# Patient Record
Sex: Male | Born: 1990 | Race: Black or African American | Hispanic: No | Marital: Single | Smoking: Current every day smoker
Health system: Southern US, Community
[De-identification: ages and names within clinical notes are randomized; demographics above are authoritative.]

## PROBLEM LIST (undated history)

## (undated) DIAGNOSIS — J45909 Unspecified asthma, uncomplicated: Secondary | ICD-10-CM

## (undated) HISTORY — PX: OTHER SURGICAL HISTORY: SHX169

---
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
## 2010-08-13 ENCOUNTER — Emergency Department: Payer: Self-pay | Admitting: Emergency Medicine

## 2011-06-18 ENCOUNTER — Emergency Department: Payer: Self-pay | Admitting: Internal Medicine

## 2012-03-31 ENCOUNTER — Emergency Department: Payer: Self-pay | Admitting: Emergency Medicine

## 2012-04-17 ENCOUNTER — Emergency Department: Payer: Self-pay | Admitting: Emergency Medicine

## 2014-01-10 ENCOUNTER — Emergency Department: Payer: Self-pay | Admitting: Emergency Medicine

## 2015-04-05 ENCOUNTER — Emergency Department: Payer: Self-pay

## 2015-04-05 ENCOUNTER — Emergency Department
Admission: EM | Admit: 2015-04-05 | Discharge: 2015-04-05 | Disposition: A | Discharge status: Home / Self Care | Payer: Self-pay | Attending: Emergency Medicine | Admitting: Emergency Medicine

## 2015-04-05 DIAGNOSIS — Z720 Tobacco use: Secondary | ICD-10-CM | POA: Insufficient documentation

## 2015-04-05 DIAGNOSIS — R079 Chest pain, unspecified: Secondary | ICD-10-CM

## 2015-04-05 DIAGNOSIS — R062 Wheezing: Secondary | ICD-10-CM

## 2015-04-05 DIAGNOSIS — J45901 Unspecified asthma with (acute) exacerbation: Secondary | ICD-10-CM | POA: Insufficient documentation

## 2015-04-05 HISTORY — DX: Unspecified asthma, uncomplicated: J45.909

## 2015-04-05 LAB — CBC
HCT: 47.8 % (ref 40.0–52.0)
Hemoglobin: 16.2 g/dL (ref 13.0–18.0)
MCH: 30.9 pg (ref 26.0–34.0)
MCHC: 33.9 g/dL (ref 32.0–36.0)
MCV: 91 fL (ref 80.0–100.0)
PLATELETS: 170 10*3/uL (ref 150–440)
RBC: 5.25 MIL/uL (ref 4.40–5.90)
RDW: 12.9 % (ref 11.5–14.5)
WBC: 8.5 10*3/uL (ref 3.8–10.6)

## 2015-04-05 LAB — BASIC METABOLIC PANEL
Anion gap: 8 (ref 5–15)
BUN: 6 mg/dL (ref 6–20)
CALCIUM: 9.3 mg/dL (ref 8.9–10.3)
CHLORIDE: 105 mmol/L (ref 101–111)
CO2: 22 mmol/L (ref 22–32)
CREATININE: 0.87 mg/dL (ref 0.61–1.24)
GFR calc Af Amer: >60 mL/min (ref >60)
GFR calc non Af Amer: >60 mL/min (ref >60)
GLUCOSE: 84 mg/dL (ref 65–99)
Potassium: 5.2 mmol/L — ABNORMAL HIGH (ref 3.5–5.1)
Sodium: 135 mmol/L (ref 135–145)

## 2015-04-05 LAB — TROPONIN I

## 2015-04-05 MED ORDER — ALBUTEROL SULFATE HFA 108 (90 BASE) MCG/ACT IN AERS: 2.0000 | INHALATION_SPRAY | Freq: Four times a day (QID) | RESPIRATORY_TRACT | Status: AC | PRN

## 2015-04-05 NOTE — ED Provider Notes (Signed)
[facility] Emergency Department Provider Note   ____________________________________________  Time seen:  I have reviewed the triage vital signs and the triage nursing note.  HISTORY  Chief Complaint Chest Pain   Historian Patient  HPI Manuel Adams is a 24 y.o. male who has a history of asthma, as well as smoking, who started experiencing some central left chest pressure associated with wheezing this morning around 9 or 10 AM. He had walked to work early this morning and experienced the discomfort at work where he has a nonexertional job at the [employer]. Symptoms progressed and around 12 he had a more significant episode where he felt his and lightheaded while wheezing. Symptoms did abate on their own without any albuterol. Patient states he took oxygen on the way in with EMS. His blood sugar was found to be in the 60s, and he reports he had not eaten since the middle of the night, and received an amp of D50 by IV because he did not want to swallow the sugar drink that the EMS provided.  Patient states he has lost some weight recently, and is still trying to lose weight as he is overweight. He is currently smoker. He has not seen a physician recently. He does not have an albuterol inhaler due to no PCP and difficult access to care given that he has no car.    Past Medical History  Diagnosis Date  . Asthma     There are no active problems to display for this patient.   History reviewed. No pertinent past surgical history.  Current Outpatient Rx  Name  Route  Sig  Dispense  Refill  . albuterol (PROVENTIL HFA;VENTOLIN HFA) 108 (90 BASE) MCG/ACT inhaler   Inhalation   Inhale 2 puffs into the lungs every 6 (six) hours as needed for wheezing or shortness of breath.   1 Inhaler   0     Allergies Review of patient's allergies indicates no known allergies.  No family history on file.  Social History Social History  Substance Use Topics  . Smoking  status: Current Every Day Smoker    Types: Cigarettes  . Smokeless tobacco: Never Used  . Alcohol Use: No    Review of Systems  Constitutional: Negative for fever. Eyes: Negative for visual changes. ENT: Negative for sore throat. Cardiovascular: Negative for palpitations. Respiratory: Negative for cough or sputum. Gastrointestinal: Negative for abdominal pain, vomiting and diarrhea. Genitourinary: Negative for dysuria. Musculoskeletal: Negative for back pain. Skin: Negative for rash. Neurological: Negative for headache. 10 point Review of Systems otherwise negative ____________________________________________   PHYSICAL EXAM:  VITAL SIGNS: ED Triage Vitals  Enc Vitals Group     BP 04/05/15 1242 137/62 mmHg     Pulse Rate 04/05/15 1242 70     Resp 04/05/15 1242 16     Temp 04/05/15 1242 99.2 F (37.3 C)     Temp Source 04/05/15 1242 Oral     SpO2 04/05/15 1242 97 %     Weight 04/05/15 1242 220 lb (99.791 kg)     Height 04/05/15 1242  (1.778 m)     Head Cir --      Peak Flow --      Pain Score 04/05/15 1243 5     Pain Loc --      Pain Edu? --      Excl. in GC? --      Constitutional: Alert and oriented. Well appearing and in no distress. Eyes:  Conjunctivae are normal. PERRL. Normal extraocular movements. ENT   Head: Normocephalic and atraumatic.   Nose: No congestion/rhinnorhea.   Mouth/Throat: Mucous membranes are moist.   Neck: No stridor. Cardiovascular/Chest: Normal rate, regular rhythm.  No murmurs, rubs, or gallops. Respiratory: Normal respiratory effort without tachypnea nor retractions. Breath sounds are clear and equal bilaterally. No wheezes/rales/rhonchi. Gastrointestinal: Soft. No distention, no guarding, no rebound. Nontender. Obese.  Genitourinary/rectal:Deferred Musculoskeletal: Nontender with normal range of motion in all extremities. No joint effusions.  No lower extremity tenderness.  No edema. Neurologic:  Normal speech and  language. No gross or focal neurologic deficits are appreciated. Skin:  Skin is warm, dry and intact. No rash noted. Psychiatric: Mood and affect are normal. Speech and behavior are normal. Patient exhibits appropriate insight and judgment.  ____________________________________________   EKG I, Governor Rooks, MD, the attending physician have personally viewed and interpreted all ECGs.  53 bpm. Sinus bradycardia. Narrow QRS. Normal axis. Normal ST and T-wave. ____________________________________________  LABS (pertinent positives/negatives)  Basic metabolic panel within normal limits except potassium 5.2 CBC within normal limits Troponin less than 0.03  ____________________________________________  RADIOLOGY All Xrays were viewed by me. Imaging interpreted by Radiologist.  Chest x-ray two-view: No acute abnormality __________________________________________  PROCEDURES  Procedure(s) performed: None  Critical Care performed: None  ____________________________________________   ED COURSE / ASSESSMENT AND PLAN  CONSULTATIONS: None  Pertinent labs & imaging results that were available during my care of the patient were reviewed by me and considered in my medical decision making (see chart for details).   This patient is young and has a history of asthma in the past and does not have an inhaler, and so I suspected his episodes today are probably related to wheezing/bronchospasm. However here in the emergency department he has absolutely no wheezing, so I am raising the consideration of whether or not his symptoms of chest pain could be cardiac in nature. His EKG is reassuring. His laboratory evaluation is negative. He does have risk factors of obesity, and smoking, and so I am going to refer him to follow-up with cardiology.  Patient needed to leave given his wife needed to be at work, and so he was unwilling to stay for repeat troponin I did let him know that I was not  recommending that he leave the hospital until his troponin repeated to be negative, however he understands the risk of missed heart attack and death and is going to leave.  He is alert and oriented and capable of decision-making.  Patient / Family / Caregiver informed of clinical course, medical decision-making process, and agree with plan.   I discussed return precautions, follow-up instructions, and discharged instructions with patient and/or family.  ___________________________________________   FINAL CLINICAL IMPRESSION(S) / ED DIAGNOSES   Final diagnoses:  Chest pain, unspecified  Wheezing       Governor Rooks, MD 04/05/15 1546

## 2015-04-05 NOTE — ED Notes (Signed)
Pt comes into the ED via EMS from home with c/o tightness in chest with diaphoresis and nausea today..states he has a hx of asthma, EMS reports pt FS60, they administered an Amp of D50 in route..states pt improved after D50.the patient states his chest feels better, skin is warm and dry on arrival..

## 2015-04-05 NOTE — ED Notes (Signed)
Unable to draw blood from right AC 20g for second troponin.  Patient refused to allow nurse to strait stick for troponin.

## 2015-04-05 NOTE — Discharge Instructions (Signed)
You were evaluated after an episode of chest pain with wheezing, and her examiner evaluation are reassuring here in the emergency department. However due to your risk factors, I am recommending he follow-up with a cardiologist to consider additional evaluation which might include a stress test. Call Dr. Elissa Hefty office for an appointment next available in the next 2 days.  You're being prescribed albuterol inhaler for as needed use for episodes of wheezing. I'm also recommending you follow with primary care physician, you may follow-up at the [facility] clinic.  Return to the emergency room for any worsening condition including trouble breathing, dizziness, passing out, chest pain, sweating, or any other symptoms concerning to you.   Nonspecific Chest Pain It is often hard to find the cause of chest pain. There is always a chance that your pain could be related to something serious, such as a heart attack or a blood clot in your lungs. Chest pain can also be caused by conditions that are not life-threatening. If you have chest pain, it is very important to follow up with your doctor.  HOME CARE  If you were prescribed an antibiotic medicine, finish it all even if you start to feel better.  Avoid any activities that cause chest pain.  Do not use any tobacco products, including cigarettes, chewing tobacco, or electronic cigarettes. If you need help quitting, ask your doctor.  Do not drink alcohol.  Take medicines only as told by your doctor.  Keep all follow-up visits as told by your doctor. This is important. This includes any further testing if your chest pain does not go away.  Your doctor may tell you to keep your head raised (elevated) while you sleep.  Make lifestyle changes as told by your doctor. These may include:  Getting regular exercise. Ask your doctor to suggest some activities that are safe for you.  Eating a heart-healthy diet. Your doctor or a diet specialist (dietitian)  can help you to learn healthy eating options.  Maintaining a healthy weight.  Managing diabetes, if necessary.  Reducing stress. GET HELP IF:  Your chest pain does not go away, even after treatment.  You have a rash with blisters on your chest.  You have a fever. GET HELP RIGHT AWAY IF:  Your chest pain is worse.  You have an increasing cough, or you cough up blood.  You have severe belly (abdominal) pain.  You feel extremely weak.  You pass out (faint).  You have chills.  You have sudden, unexplained chest discomfort.  You have sudden, unexplained discomfort in your arms, back, neck, or jaw.  You have shortness of breath at any time.  You suddenly start to sweat, or your skin gets clammy.  You feel nauseous.  You vomit.  You suddenly feel light-headed or dizzy.  Your heart begins to beat quickly, or it feels like it is skipping beats. These symptoms may be an emergency. Do not wait to see if the symptoms will go away. Get medical help right away. Call your local emergency services (911 in the [place] Do not drive yourself to the hospital.   This information is not intended to replace advice given to you by your health care provider. Make sure you discuss any questions you have with your health care provider.   Document Released: 12/04/2007 Document Revised: 07/08/2014 Document Reviewed: 01/21/2014 Elsevier Interactive Patient Education 2016 [employer]   Asthma, Acute Bronchospasm Acute bronchospasm caused by asthma is also referred to as an asthma attack. Bronchospasm  means your air passages become narrowed. The narrowing is caused by inflammation and tightening of the muscles in the air tubes (bronchi) in your lungs. This can make it hard to breathe or cause you to wheeze and cough. CAUSES Possible triggers are:  Animal dander from the skin, hair, or feathers of animals.  Dust mites contained in house dust.  Cockroaches.  Pollen from trees or  grass.  Mold.  Cigarette or tobacco smoke.  Air pollutants such as dust, household cleaners, hair sprays, aerosol sprays, paint fumes, strong chemicals, or strong odors.  Cold air or weather changes. Cold air may trigger inflammation. Winds increase molds and pollens in the air.  Strong emotions such as crying or laughing hard.  Stress.  Certain medicines such as aspirin or beta-blockers.  Sulfites in foods and drinks, such as dried fruits and wine.  Infections or inflammatory conditions, such as a flu, cold, or inflammation of the nasal membranes (rhinitis).  Gastroesophageal reflux disease (GERD). GERD is a condition where stomach acid backs up into your esophagus.  Exercise or strenuous activity. SIGNS AND SYMPTOMS   Wheezing.  Excessive coughing, particularly at night.  Chest tightness.  Shortness of breath. DIAGNOSIS  Your health care provider will ask you about your medical history and perform a physical exam. A chest X-ray or blood testing may be performed to look for other causes of your symptoms or other conditions that may have triggered your asthma attack. TREATMENT  Treatment is aimed at reducing inflammation and opening up the airways in your lungs. Most asthma attacks are treated with inhaled medicines. These include quick relief or rescue medicines (such as bronchodilators) and controller medicines (such as inhaled corticosteroids). These medicines are sometimes given through an inhaler or a nebulizer. Systemic steroid medicine taken by mouth or given through an IV tube also can be used to reduce the inflammation when an attack is moderate or severe. Antibiotic medicines are only used if a bacterial infection is present.  HOME CARE INSTRUCTIONS   Rest.  Drink plenty of liquids. This helps the mucus to remain thin and be easily coughed up. Only use caffeine in moderation and do not use alcohol until you have recovered from your illness.  Do not smoke. Avoid  being exposed to secondhand smoke.  You play a critical role in keeping yourself in good health. Avoid exposure to things that cause you to wheeze or to have breathing problems.  Keep your medicines up-to-date and available. Carefully follow your health care provider's treatment plan.  Take your medicine exactly as prescribed.  When pollen or pollution is bad, keep windows closed and use an air conditioner or go to places with air conditioning.  Asthma requires careful medical care. See your health care provider for a follow-up as advised. If you are more than [redacted] weeks pregnant and you were prescribed any new medicines, let your obstetrician know about the visit and how you are doing. Follow up with your health care provider as directed.  After you have recovered from your asthma attack, make an appointment with your outpatient doctor to talk about ways to reduce the likelihood of future attacks. If you do not have a doctor who manages your asthma, make an appointment with a primary care doctor to discuss your asthma. SEEK IMMEDIATE MEDICAL CARE IF:   You are getting worse.  You have trouble breathing. If severe, call your local emergency services (911 in the [place]  You develop chest pain or discomfort.  You are  vomiting.  You are not able to keep fluids down.  You are coughing up yellow, green, brown, or bloody sputum.  You have a fever and your symptoms suddenly get worse.  You have trouble swallowing. MAKE SURE YOU:   Understand these instructions.  Will watch your condition.  Will get help right away if you are not doing well or get worse.   This information is not intended to replace advice given to you by your health care provider. Make sure you discuss any questions you have with your health care provider.   Document Released: 10/02/2006 Document Revised: 06/22/2013 Document Reviewed: 12/23/2012 Elsevier Interactive Patient Education [employer]

## 2015-09-08 ENCOUNTER — Emergency Department
Admission: EM | Admit: 2015-09-08 | Discharge: 2015-09-08 | Discharge status: Against Medical Advice | Payer: Self-pay | Attending: Emergency Medicine | Admitting: Emergency Medicine

## 2015-09-08 ENCOUNTER — Encounter: Payer: Self-pay | Admitting: Emergency Medicine

## 2015-09-08 DIAGNOSIS — J45909 Unspecified asthma, uncomplicated: Secondary | ICD-10-CM | POA: Insufficient documentation

## 2015-09-08 DIAGNOSIS — T781XXA Other adverse food reactions, not elsewhere classified, initial encounter: Secondary | ICD-10-CM | POA: Insufficient documentation

## 2015-09-08 DIAGNOSIS — T7840XA Allergy, unspecified, initial encounter: Secondary | ICD-10-CM

## 2015-09-08 DIAGNOSIS — F17210 Nicotine dependence, cigarettes, uncomplicated: Secondary | ICD-10-CM | POA: Insufficient documentation

## 2015-09-08 MED ORDER — METHYLPREDNISOLONE SODIUM SUCC 125 MG IJ SOLR: 125.0000 mg | INTRAMUSCULAR | Status: DC

## 2015-09-08 MED ORDER — SODIUM CHLORIDE 0.9 % IV BOLUS (SEPSIS): 1000.0000 mL | Freq: Once | INTRAVENOUS | Status: DC

## 2015-09-08 MED ORDER — EPINEPHRINE 0.3 MG/0.3ML IJ SOAJ: 0.3000 mg | Freq: Once | INTRAMUSCULAR | Status: DC

## 2015-09-08 MED ORDER — FAMOTIDINE IN NACL 20-0.9 MG/50ML-% IV SOLN: 20.0000 mg | Freq: Once | INTRAVENOUS | Status: DC

## 2015-09-08 MED ORDER — PREDNISONE 20 MG PO TABS: 40.0000 mg | ORAL_TABLET | Freq: Every day | ORAL | Status: DC

## 2015-09-08 NOTE — Discharge Instructions (Signed)

## 2015-09-08 NOTE — ED Notes (Signed)
MD explained risks of leaving AMA without further examination/treatment.

## 2015-09-08 NOTE — ED Notes (Signed)
Pt with no known food allergies ate hush puppies from cook[employer] and then ate reese's pieces and felt his lips swelling and started itching. Ate a reese's stick yesterday with no reaction. Hx asthma.

## 2015-09-08 NOTE — ED Provider Notes (Signed)
[facility] Emergency Department Provider Note  ____________________________________________  Time seen: Approximately 1:49 PM  I have reviewed the triage vital signs and the nursing notes.   HISTORY  Chief Complaint Allergic Reaction    HPI Manuel Adams is a 25 y.o. male presents for evaluation of a "allergic reaction". About one hour ago the patient ate hush puppies from a restaurant and started to develop severe itching in his arms and legs, hives, and then he felt like he is having trouble talking because his lips were so swollen. He reports the lip swelling seems to be improving, and his itching started a little better after some Benadryl, however continues to have swollen lips. Denies any wheezing or trouble breathing. He doesn't a history of asthma but states does not feel he is any asthma-like symptoms. No nausea or vomiting. No chest pain.  Denies any known previous allergies. States he will be not be eating these hush puppies again.  Past Medical History  Diagnosis Date  . Asthma     There are no active problems to display for this patient.   History reviewed. No pertinent past surgical history.  Current Outpatient Rx  Name  Route  Sig  Dispense  Refill  . albuterol (PROVENTIL HFA;VENTOLIN HFA) 108 (90 BASE) MCG/ACT inhaler   Inhalation   Inhale 2 puffs into the lungs every 6 (six) hours as needed for wheezing or shortness of breath.   1 Inhaler   0   . EPINEPHrine 0.3 mg/0.3 mL IJ SOAJ injection   Intramuscular   Inject 0.3 mLs (0.3 mg total) into the muscle once.   2 Device   0   . predniSONE (DELTASONE) 20 MG tablet   Oral   Take 2 tablets (40 mg total) by mouth daily.   10 tablet   0     Allergies Review of patient's allergies indicates no known allergies.  History reviewed. No pertinent family history.  Social History Social History  Substance Use Topics  . Smoking status: Current Every Day Smoker    Types:  Cigarettes  . Smokeless tobacco: Never Used  . Alcohol Use: No    Review of Systems Constitutional: No fever/chills Eyes: No visual changes. ENT: No sore throat.Lips did get swollen, improving now. Cardiovascular: Denies chest pain. Respiratory: Denies shortness of breath. Gastrointestinal: No abdominal pain.  No nausea, no vomiting.  No diarrhea.  No constipation. Genitourinary: Negative for dysuria. Musculoskeletal: Negative for back pain. Skin: Itching all over. Neurological: Negative for headaches, focal weakness or numbness.  10-point ROS otherwise negative.  ____________________________________________   PHYSICAL EXAM:  VITAL SIGNS: ED Triage Vitals  Enc Vitals Group     BP 09/08/15 1348 124/69 mmHg     Pulse Rate 09/08/15 1348 98     Resp --      Temp 09/08/15 1348 97.5 F (36.4 C)     Temp Source 09/08/15 1348 Oral     SpO2 09/08/15 1348 97 %     Weight 09/08/15 1348 240 lb (108.863 kg)     Height 09/08/15 1348  (1.778 m)     Head Cir --      Peak Flow --      Pain Score --      Pain Loc --      Pain Edu? --      Excl. in GC? --    Constitutional: Alert and oriented. Well appearing and in no acute distress. Eyes: Conjunctivae are normal. PERRL.  EOMI. Head: Atraumatic. Nose: No congestion/rhinnorhea. Mouth/Throat: Mucous membranes are moist.  Oropharynx non-erythematous.No intraoral edema. No lingular edema. Oropharynx is widely patent. Patient does have mild angioedema of the lips. Neck: No stridor.  Normal voice, states it seemed a little hoarse earlier but has improved. Cardiovascular: Normal rate, regular rhythm. Grossly normal heart sounds.  Good peripheral circulation. Respiratory: Normal respiratory effort.  No retractions. Lungs CTAB. No wheezing. Gastrointestinal: Soft and nontender. No distention.  Musculoskeletal: No lower extremity tenderness nor edema.  No joint effusions. Neurologic:  Normal speech and language. No gross focal neurologic  deficits are appreciated. Skin:  Skin is warm, dry and intact however occasional small urticaria are noted on the arms and legs, in addition patient has excoriations from itching.  Psychiatric: Mood and affect are normal. Speech and behavior are normal.  ____________________________________________   LABS (all labs ordered are listed, but only abnormal results are displayed)  Labs Reviewed - No data to display ____________________________________________  EKG   ____________________________________________  RADIOLOGY   ____________________________________________   PROCEDURES  Procedure(s) performed: None  Critical Care performed: No  ____________________________________________   INITIAL IMPRESSION / ASSESSMENT AND PLAN / ED COURSE  Pertinent labs & imaging results that were available during my care of the patient were reviewed by me and considered in my medical decision making (see chart for details).  Patient returns with sudden onset urticaria, itching of the arms and legs, swelling of his lips. Overall hemodynamics are stable, he appears to be having a moderate reaction. Presently his lips are slightly swollen but he reports that they are improving quickly. He is not having any respiratory distress and his voice is normal at this time. I will give the patient IV Solu-Medrol in addition to 50 mg Benadryl which she has received from EMS, Pepcid, fluids and will observe him very closely. He'll be consistent with a moderate allergic reaction and associated angioedema, likely due to food he was seen though exact ingredient unknown.  ----------------------------------------- 2:31 PM on 09/08/2015 -----------------------------------------  Patient reports that he has to pick up his children, he is called others but no one is available to assist him. We did offer to assist social work assist, but the patient states he must go. He does feel much better, states the swelling is  improved and it does appear that his lip swelling has regressed. His lungs are clear he is in no distress speaking comfortably with normal vital signs. I will sign him out AGAINST MEDICAL ADVICE as I do recommend further observation to him, but the patient will sign out AGAINST MEDICAL ADVICE and will prescribe him prednisone, EpiPen, and have reviewed very careful treatment recommendations and return precautions.  09/08/2015 at 2:31 PM:  The patient requested to leave.  I considered this to be leaving against medical advice. I personally discussed the following with them:  1)  That they currently had a medical condition of a moderate to severe allergic reaction and I am concerned that they may have anaphylaxis, which is a life-threatening allergy thickened swell up his airway and stop him from breathing.    2)  My proposed course of evaluation and treatment includes, but is not limited to,  continued observation in the emergency room.  Benefits of staying include possible diagnosis or excluding of worsening reaction or an alternative serious condition such as anaphylaxis, which if identified early would lead to appropriate intervention in a timely manner lessening the burden of disability and death.  3) Risks of leaving before  this had been completed include: misdiagnosis, worsening illness leading up to and including prolonged or permanent disability or death.  Specific risks pertinent, but not all inclusive, of their current medical condition include but are not limited to death, severe swelling in inability to breathe.  I also discussed alternatives including arranging to have social worker or school assist with his children's care this evening, the patient does not wish for this.  Despite this they stated they wanted to leave due to childcare needs and refused further evaluation, treatment, or admission at this time.   They appeared clinically sober, were mentating appropriately, were free from  distracting injury, had adequately controlled acute pain, appeared to have intact insight, judgment, and reason, and in my opinion had the capacity to make this decision.  Specifically, they were able to verbally state back in a coherent manner their current medical condition/current diagnosis, the proposed course of evaluation and/or treatment, and the risks, benefits, and alternatives of treatment versus leaving against medical advice.   They understand that they may return to seek medical attention here at ANY time they want.  I strongly advised them to return to the Emergency Department immediately if they experience any new or worsening symptoms that concern them, or simply if they reconsider continued evaluation and/or treatment as previously discussed.  This would be without any repercussions, though they understand they likely will need to wait again in the Emergency Department if other patients are in front of them, rather than being brought straight back.  They understood this is another advantage of staying, but still insisted upon leaving.  I recommended they follow-up with his primary doctor or allergist at [facility] ear nose throat at the earliest available opportunity/appointment for further evaluation and treatment.   The patient was discharged against medical advice.  They did accept written discharge instructions.  Patient awake alert and well-oriented. No distress ____________________________________________   FINAL CLINICAL IMPRESSION(S) / ED DIAGNOSES  Final diagnoses:  Allergic reaction, initial encounter      Sharyn Creamer, MD 09/08/15 1443

## 2016-03-03 ENCOUNTER — Emergency Department
Admission: EM | Admit: 2016-03-03 | Discharge: 2016-03-03 | Disposition: A | Discharge status: Home / Self Care | Payer: Self-pay | Attending: Student in an Organized Health Care Education/Training Program | Admitting: Student in an Organized Health Care Education/Training Program

## 2016-03-03 ENCOUNTER — Encounter: Payer: Self-pay | Admitting: Emergency Medicine

## 2016-03-03 DIAGNOSIS — F17210 Nicotine dependence, cigarettes, uncomplicated: Secondary | ICD-10-CM | POA: Insufficient documentation

## 2016-03-03 DIAGNOSIS — J45909 Unspecified asthma, uncomplicated: Secondary | ICD-10-CM | POA: Insufficient documentation

## 2016-03-03 DIAGNOSIS — Y9389 Activity, other specified: Secondary | ICD-10-CM | POA: Insufficient documentation

## 2016-03-03 DIAGNOSIS — S61219A Laceration without foreign body of unspecified finger without damage to nail, initial encounter: Secondary | ICD-10-CM

## 2016-03-03 DIAGNOSIS — S61210A Laceration without foreign body of right index finger without damage to nail, initial encounter: Secondary | ICD-10-CM | POA: Insufficient documentation

## 2016-03-03 DIAGNOSIS — W3182XA Contact with other commercial machinery, initial encounter: Secondary | ICD-10-CM | POA: Insufficient documentation

## 2016-03-03 DIAGNOSIS — Y990 Civilian activity done for income or pay: Secondary | ICD-10-CM | POA: Insufficient documentation

## 2016-03-03 DIAGNOSIS — Y9269 Other specified industrial and construction area as the place of occurrence of the external cause: Secondary | ICD-10-CM | POA: Insufficient documentation

## 2016-03-03 NOTE — Discharge Instructions (Signed)
Keep the wound clean, dry, and covered. Avoid using any cream, oils, ointments, or lotions over the wound adhesive. Use gloves as appropriate.

## 2016-03-03 NOTE — ED Triage Notes (Signed)
Pt cut end of finger #2 right hand the tomato slicer at work. States he had  tetanus  Shot last year. Does not want worker [employer] filed.

## 2016-03-03 NOTE — ED Provider Notes (Signed)
[facility] Emergency Department Provider Note ____________________________________________  Time seen: 1303  I have reviewed the triage vital signs and the nursing notes.  HISTORY  Chief Complaint  Laceration  HPI Manuel Adams is a 25 y.o. male presents to the ED from work for treatment of a laceration to the right index finger. The patient cut his finger while using a tomato slicer at work. He reports a current tetanus status. He has a gauze holding pressure, but reports continued oozing.   Past Medical History:  Diagnosis Date  . Asthma     There are no active problems to display for this patient.   History reviewed. No pertinent surgical history.  Prior to Admission medications   Medication Sig Start Date End Date Taking? Authorizing Provider  albuterol (PROVENTIL HFA;VENTOLIN HFA) 108 (90 BASE) MCG/ACT inhaler Inhale 2 puffs into the lungs every 6 (six) hours as needed for wheezing or shortness of breath. 04/05/15   Governor Rooksebecca Lord, MD  EPINEPHrine 0.3 mg/0.3 mL IJ SOAJ injection Inject 0.3 mLs (0.3 mg total) into the muscle once. 09/08/15   Sharyn CreamerMark Quale, MD  predniSONE (DELTASONE) 20 MG tablet Take 2 tablets (40 mg total) by mouth daily. 09/08/15   Sharyn CreamerMark Quale, MD   Allergies Review of patient's allergies indicates no known allergies.  History reviewed. No pertinent family history.  Social History Social History  Substance Use Topics  . Smoking status: Current Every Day Smoker    Types: Cigarettes  . Smokeless tobacco: Never Used  . Alcohol use No   Review of Systems  Constitutional: Negative for fever. Cardiovascular: Negative for chest pain. Respiratory: Negative for shortness of breath. Musculoskeletal: Negative for back pain. Skin: Negative for rash. Right index finger laceration Neurological: Negative for headaches, focal weakness or numbness. ____________________________________________  PHYSICAL EXAM:  VITAL SIGNS: ED Triage Vitals   Enc Vitals Group     BP 03/03/16 1224 124/66     Pulse Rate 03/03/16 1224 78     Resp --      Temp 03/03/16 1224 98.5 F (36.9 C)     Temp Source 03/03/16 1224 Oral     SpO2 03/03/16 1224 97 %     Weight 03/03/16 1224 243 lb (110.2 kg)     Height 03/03/16 1224 5\' 10"  (1.778 m)     Head Circumference --      Peak Flow --      Pain Score 03/03/16 1225 7     Pain Loc --      Pain Edu? --      Excl. in GC? --    Constitutional: Alert and oriented. Well appearing and in no distress. Head: Normocephalic and atraumatic. Cardiovascular: Normal distal pulses and cap refill.   Musculoskeletal: Normal composite grip strength. Nontender with normal range of motion in all extremities.  Neurologic:  Normal gross sensation. No gross focal neurologic deficits are appreciated. Skin:  Skin is warm, dry and intact. No rash noted. Right index finger distal tip with superfical linear  laceration across the fat pad.  ____________________________________________  PROCEDURES  LACERATION REPAIR Performed by: Lissa HoardMenshew, Angelina Neece V Bacon Authorized by: Lissa HoardMenshew, Trevian Hayashida V Bacon Consent: Verbal consent obtained. Risks and benefits: risks, benefits and alternatives were discussed Consent given by: patient Patient identity confirmed: provided demographic data Prepped and Draped in normal sterile fashion Wound explored  Laceration Location: right index finger  Laceration Length: 1 cm  No Foreign Bodies seen or palpated  Anesthesia: none  Irrigation method: gauze +  wound cleanser Amount of cleaning: standard  Skin closure: cyanoacrylate wound adhesive.   Patient tolerance: Patient tolerated the procedure well with no immediate complications. ____________________________________________  INITIAL IMPRESSION / ASSESSMENT AND PLAN / ED COURSE  Patient with a superficial fingertip laceration s/p wound adhesive repair. Wound care instructions are provided. Work note for RTW tomorrow provided. 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   Clinical Course   ____________________________________________  FINAL CLINICAL IMPRESSION(S) / ED DIAGNOSES  Final diagnoses:  Laceration of finger, initial encounter      Lissa Hoard, PA-C 03/03/16 1400    Willy Eddy, MD 03/03/16 [phone]

## 2016-03-03 NOTE — ED Notes (Signed)
Pt verbalized understanding of discharge instructions. NAD at this time. 

## 2016-09-16 ENCOUNTER — Emergency Department
Admission: EM | Admit: 2016-09-16 | Discharge: 2016-09-16 | Disposition: A | Discharge status: Home / Self Care | Payer: Self-pay

## 2016-09-16 NOTE — ED Notes (Signed)
Pt called for triage. Pt did not present. [employer] officer states pt left with a friend.

## 2016-09-16 NOTE — ED Triage Notes (Signed)
Pt arrived via EMS for reports of cough for two days. Pt tried to walk home after giving plasma and cough increased and he felt weak and called EMS. Pt has history of asthma. EMS reports VSS.

## 2016-09-16 NOTE — ED Notes (Signed)
Pt called in ER waiting room, no response

## 2016-10-05 IMAGING — CR DG CHEST 2V
1 series · 2 of 2 positions shown · non-contrast
Comparison: None.

CLINICAL DATA: Chest pain and shortness of breath for 1 day

EXAM:
CHEST  2 VIEW

[Series 1: dg chest 2 view · 0.14mm/px · 2 of 2 slices shown]
[im 1/2]
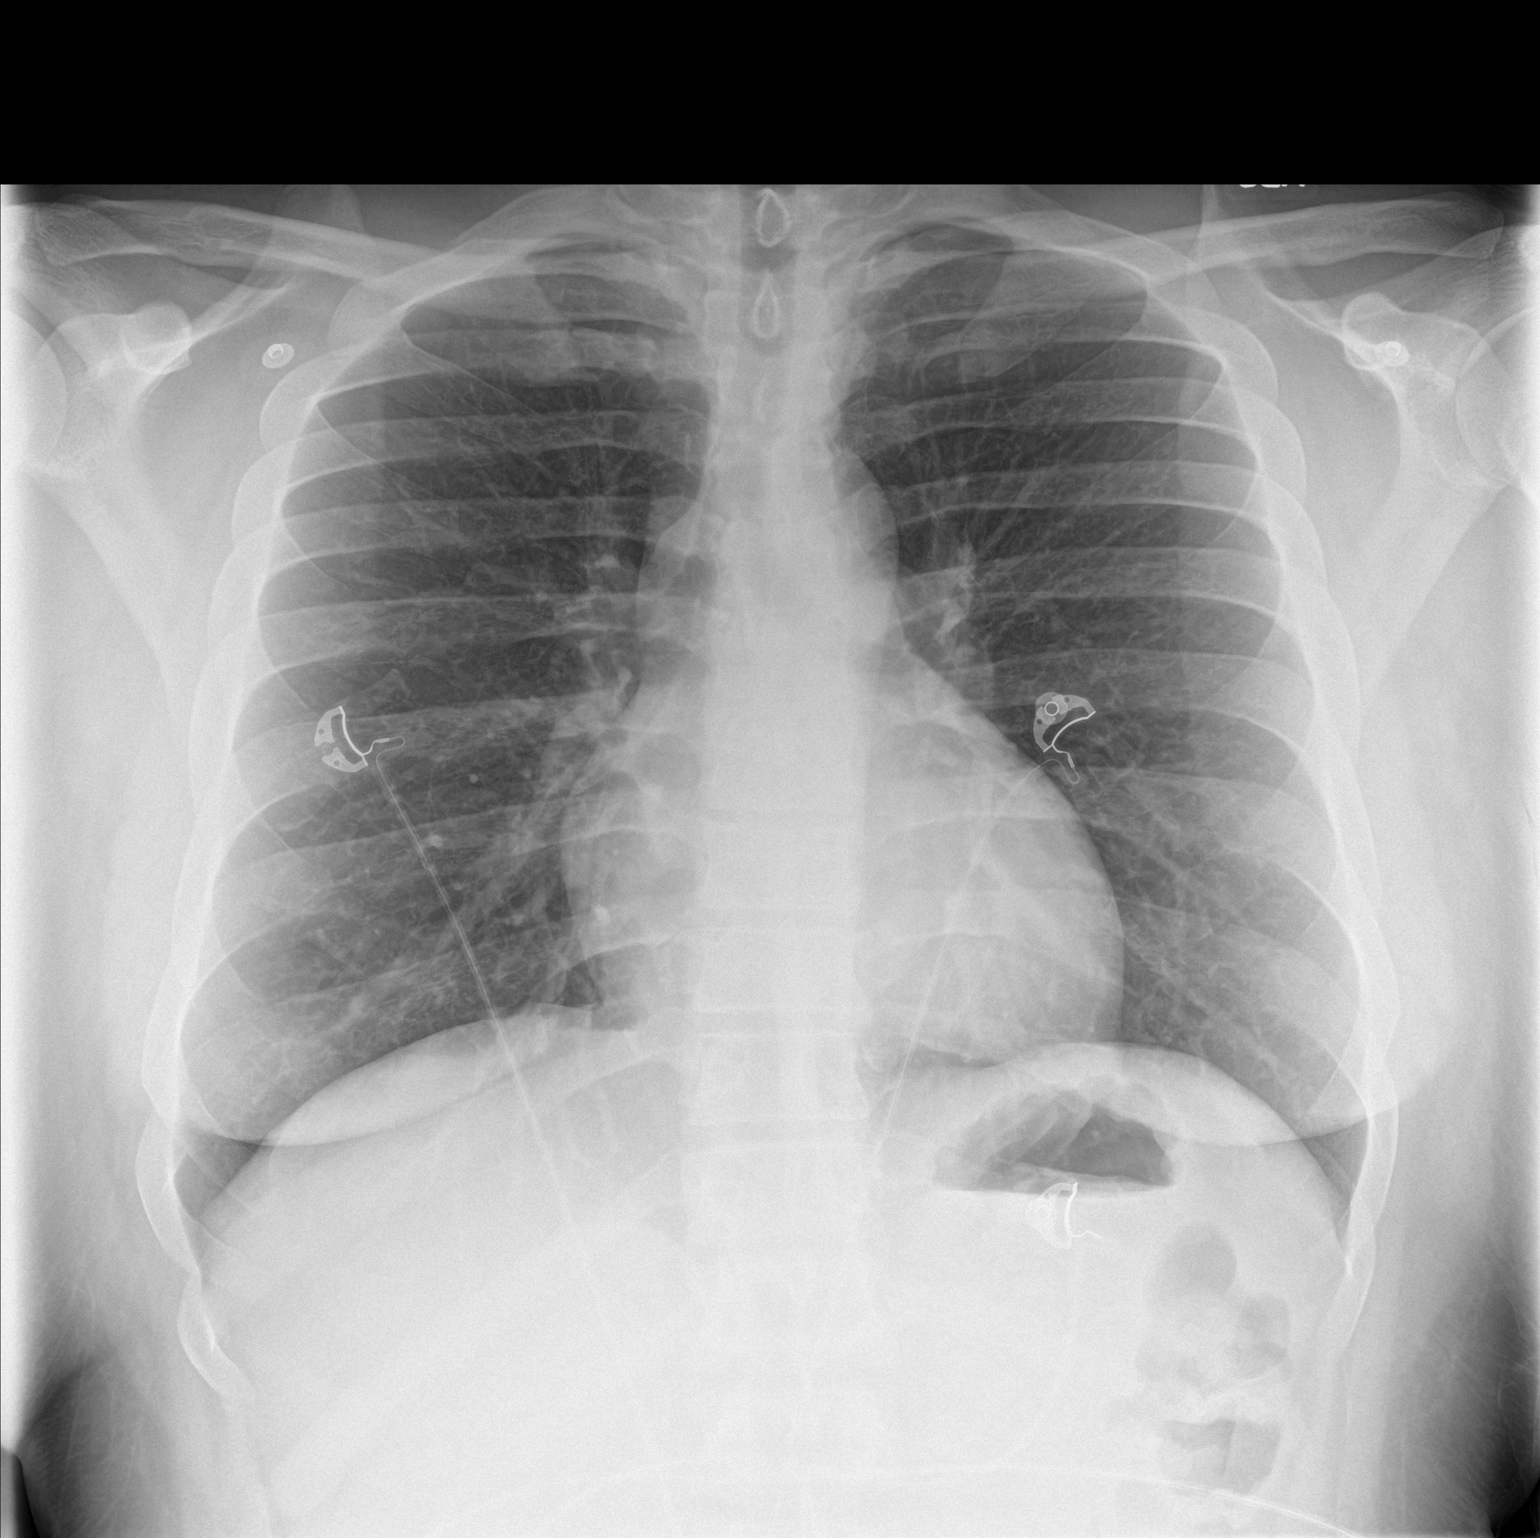
[im 2/2]
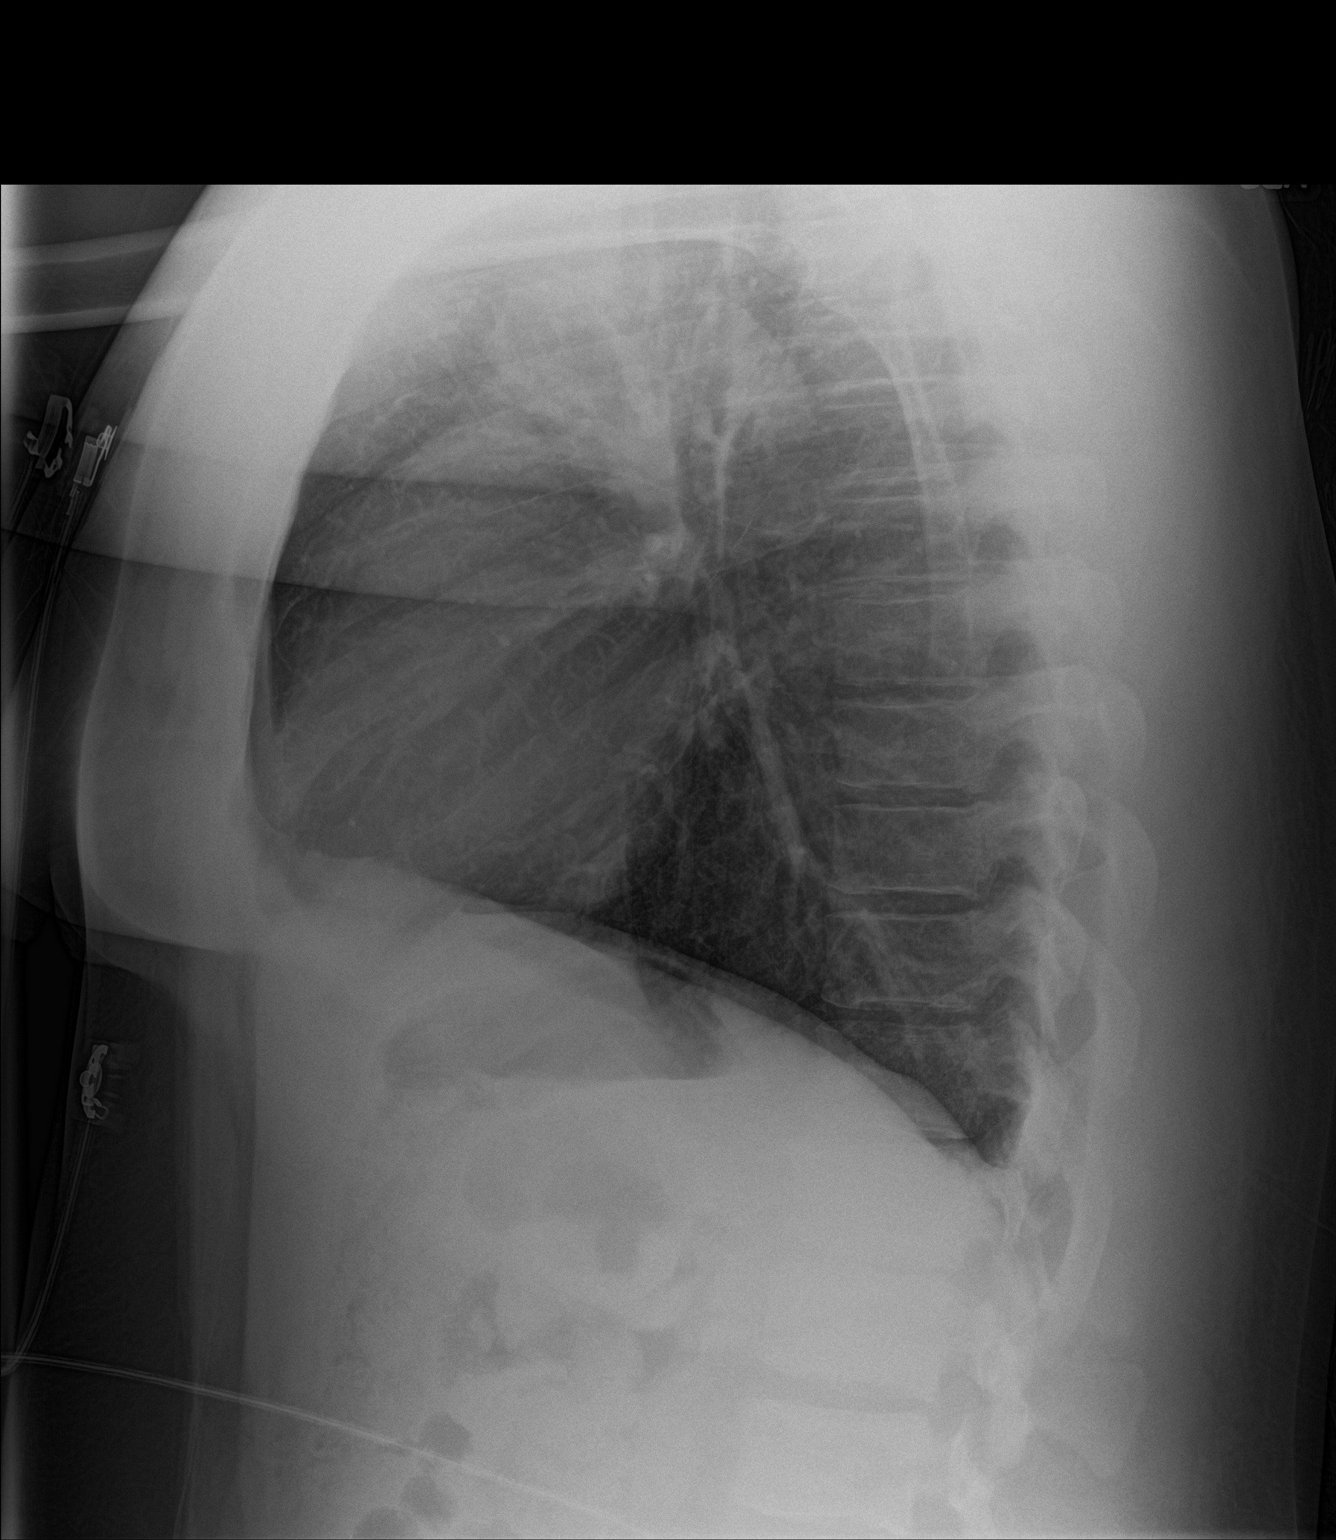

[2 of 2 positions shown; findings below may reference images not displayed]

FINDINGS: Lungs are clear. Heart size and pulmonary vascularity are normal. No
adenopathy. No pneumothorax. No bone lesions.
IMPRESSION: No abnormality noted.

## 2017-01-08 ENCOUNTER — Emergency Department
Admission: EM | Admit: 2017-01-08 | Discharge: 2017-01-08 | Disposition: A | Discharge status: Home / Self Care | Payer: Self-pay | Attending: Emergency Medicine | Admitting: Emergency Medicine

## 2017-01-08 ENCOUNTER — Encounter: Payer: Self-pay | Admitting: Emergency Medicine

## 2017-01-08 DIAGNOSIS — J45909 Unspecified asthma, uncomplicated: Secondary | ICD-10-CM | POA: Insufficient documentation

## 2017-01-08 DIAGNOSIS — Z7952 Long term (current) use of systemic steroids: Secondary | ICD-10-CM | POA: Insufficient documentation

## 2017-01-08 DIAGNOSIS — A6002 Herpesviral infection of other male genital organs: Secondary | ICD-10-CM | POA: Insufficient documentation

## 2017-01-08 DIAGNOSIS — F17210 Nicotine dependence, cigarettes, uncomplicated: Secondary | ICD-10-CM | POA: Insufficient documentation

## 2017-01-08 DIAGNOSIS — A6000 Herpesviral infection of urogenital system, unspecified: Secondary | ICD-10-CM

## 2017-01-08 MED ORDER — PENCICLOVIR 1 % EX CREA: 1.0000 "application " | TOPICAL_CREAM | CUTANEOUS | 0 refills | Status: AC

## 2017-01-08 MED ORDER — ACYCLOVIR 400 MG PO TABS: 400.0000 mg | ORAL_TABLET | Freq: Every day | ORAL | 0 refills | Status: AC

## 2017-01-08 NOTE — ED Provider Notes (Signed)
[facility] Emergency Department Provider Note   ____________________________________________   First MD Initiated Contact with Patient 01/08/17 1501     (approximate)  I have reviewed the triage vital signs and the nursing notes.   HISTORY  Chief Complaint Rash    HPI Manuel Adams is a 26 y.o. male patient awakened this morning for rash anterior inferior mandible area. Patient stated area is "burning and itching". No palliative measures for complaint.No palliative measures for complaint.   Past Medical History:  Diagnosis Date  . Asthma     There are no active problems to display for this patient.   History reviewed. No pertinent surgical history.  Prior to Admission medications   Medication Sig Start Date End Date Taking? Authorizing Provider  acyclovir (ZOVIRAX) 400 MG tablet Take 1 tablet (400 mg total) by mouth 5 (five) times daily. 01/08/17 01/18/17  Joni ReiningSmith, Raynah Gomes K, PA-C  albuterol (PROVENTIL HFA;VENTOLIN HFA) 108 (90 BASE) MCG/ACT inhaler Inhale 2 puffs into the lungs every 6 (six) hours as needed for wheezing or shortness of breath. 04/05/15   Governor RooksLord, Rebecca, MD  EPINEPHrine 0.3 mg/0.3 mL IJ SOAJ injection Inject 0.3 mLs (0.3 mg total) into the muscle once. 09/08/15   Sharyn CreamerQuale, Mark, MD  penciclovir (DENAVIR) 1 % cream Apply 1 application topically every 2 (two) hours. 01/08/17   Joni ReiningSmith, Mujtaba Bollig K, PA-C  predniSONE (DELTASONE) 20 MG tablet Take 2 tablets (40 mg total) by mouth daily. 09/08/15   Sharyn CreamerQuale, Mark, MD    Allergies Patient has no known allergies.  No family history on file.  Social History Social History  Substance Use Topics  . Smoking status: Current Every Day Smoker    Types: Cigarettes  . Smokeless tobacco: Never Used  . Alcohol use No    Review of Systems  Constitutional: No fever/chills Eyes: No visual changes. ENT: No sore throat. Cardiovascular: Denies chest pain. Respiratory: Denies shortness of  breath. Gastrointestinal: No abdominal pain.  No nausea, no vomiting.  No diarrhea.  No constipation. Genitourinary: Negative for dysuria. Musculoskeletal: Negative for back pain. Skin: Positive for rash. Neurological: Negative for headaches, focal weakness or numbness.   ____________________________________________   PHYSICAL EXAM:  VITAL SIGNS: ED Triage Vitals  Enc Vitals Group     BP 01/08/17 1445 122/74     Pulse Rate 01/08/17 1445 (!) 18     Resp 01/08/17 1445 18     Temp 01/08/17 1445 (!) 97.4 F (36.3 C)     Temp Source 01/08/17 1445 Oral     SpO2 01/08/17 1445 98 %     Weight 01/08/17 1422 263 lb (119.3 kg)     Height 01/08/17 1422 5\' 10"  (1.778 m)     Head Circumference --      Peak Flow --      Pain Score 01/08/17 1422 0     Pain Loc --      Pain Edu? --      Excl. in GC? --     Constitutional: Alert and oriented. Well appearing and in no acute distress. Eyes: Conjunctivae are normal. PERRL. EOMI. Head: Atraumatic. Nose: No congestion/rhinnorhea. Mouth/Throat: Mucous membranes are moist.  Oropharynx non-erythematous. Neck: No stridor.  No cervical spine tenderness to palpation. Hematological/Lymphatic/Immunilogical: No cervical lymphadenopathy. Cardiovascular: Normal rate, regular rhythm. Grossly normal heart sounds.  Good peripheral circulation. Respiratory: Normal respiratory effort.  No retractions. Lungs CTAB. Gastrointestinal: Soft and nontender. No distention. No abdominal bruits. No CVA tenderness. Musculoskeletal: No lower extremity  tenderness nor edema.  No joint effusions. Neurologic:  Normal speech and language. No gross focal neurologic deficits are appreciated. No gait instability. Skin:  Skin is warm, dry and intact. Vesicle lesion anterior inferior mandible Psychiatric: Mood and affect are normal. Speech and behavior are normal.  ____________________________________________   LABS (all labs ordered are listed, but only abnormal results are  displayed)  Labs Reviewed - No data to display ____________________________________________  EKG   ____________________________________________  RADIOLOGY  No results found.  ____________________________________________   PROCEDURES  Procedure(s) performed: None  Procedures  Critical Care performed: No  ____________________________________________   INITIAL IMPRESSION / ASSESSMENT AND PLAN / ED COURSE  Pertinent labs & imaging results that were available during my care of the patient were reviewed by me and considered in my medical decision making (see chart for details).  Herpes simplex. Patient given discharge Instructions. Patient [facility].      ____________________________________________   FINAL CLINICAL IMPRESSION(S) / ED DIAGNOSES  Final diagnoses:  Genital herpes simplex type 1 infection      NEW MEDICATIONS STARTED DURING THIS VISIT:  New Prescriptions   ACYCLOVIR (ZOVIRAX) 400 MG TABLET    Take 1 tablet (400 mg total) by mouth 5 (five) times daily.   PENCICLOVIR (DENAVIR) 1 % CREAM    Apply 1 application topically every 2 (two) hours.     Note:  This document was prepared using Dragon voice recognition software and may include unintentional dictation errors.    Joni Reining, PA-C 01/08/17 1528    Minna Antis, MD 01/08/17 [phone]

## 2017-01-08 NOTE — ED Triage Notes (Signed)
Patient presents to the ED with a small rash on his chin that patient states itches.  Patient states he first noticed area on waking up this am.  Patient is in no obvious distress at this time.

## 2017-01-08 NOTE — ED Notes (Signed)
Discussed discharge instructions, prescriptions, and follow-up care with patient. No questions or concerns at this time. Pt stable at discharge.  

## 2017-01-18 ENCOUNTER — Emergency Department
Admission: EM | Admit: 2017-01-18 | Discharge: 2017-01-18 | Disposition: A | Discharge status: Home / Self Care | Payer: Self-pay | Attending: Emergency Medicine | Admitting: Emergency Medicine

## 2017-01-18 ENCOUNTER — Emergency Department: Payer: Self-pay

## 2017-01-18 ENCOUNTER — Encounter: Payer: Self-pay | Admitting: Emergency Medicine

## 2017-01-18 DIAGNOSIS — J45909 Unspecified asthma, uncomplicated: Secondary | ICD-10-CM | POA: Insufficient documentation

## 2017-01-18 DIAGNOSIS — Z79899 Other long term (current) drug therapy: Secondary | ICD-10-CM | POA: Insufficient documentation

## 2017-01-18 DIAGNOSIS — M25521 Pain in right elbow: Secondary | ICD-10-CM | POA: Insufficient documentation

## 2017-01-18 DIAGNOSIS — Z91010 Allergy to peanuts: Secondary | ICD-10-CM | POA: Insufficient documentation

## 2017-01-18 DIAGNOSIS — F17210 Nicotine dependence, cigarettes, uncomplicated: Secondary | ICD-10-CM | POA: Insufficient documentation

## 2017-01-18 NOTE — ED Notes (Signed)
Pt states he needs to leave by 2330. Pt then asks this RN for a work note. Pt informed radiologist may not have results of xray before 2330 and that pt is free to leave at any time if he cannot wait. Pt states "ya'll need to hurry up then." explanation again provided to pt regarding flow and expected wait times for imaging process. Pt continues to appear displeased and does not verbalize understanding.

## 2017-01-18 NOTE — Discharge Instructions (Signed)
Utilize over-the-counter ibuprofen or Aleve as directed on the medication bottle for symptom relief.

## 2017-01-18 NOTE — ED Triage Notes (Signed)
Patient with complaint of right arm pain that started about an hour ago. Patient states that he slept on his arm and when he woke up it was hurting. Patient denies any recent injury. Patient states that when he was 6 he broke his right arm and that he has a rod in his arm.
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
## 2017-05-08 ENCOUNTER — Other Ambulatory Visit: Payer: Self-pay

## 2017-05-08 ENCOUNTER — Encounter: Payer: Self-pay | Admitting: Emergency Medicine

## 2017-05-08 ENCOUNTER — Emergency Department
Admission: EM | Admit: 2017-05-08 | Discharge: 2017-05-08 | Disposition: A | Discharge status: Home / Self Care | Payer: Self-pay | Attending: Student in an Organized Health Care Education/Training Program | Admitting: Student in an Organized Health Care Education/Training Program

## 2017-05-08 DIAGNOSIS — Z79899 Other long term (current) drug therapy: Secondary | ICD-10-CM | POA: Insufficient documentation

## 2017-05-08 DIAGNOSIS — L03012 Cellulitis of left finger: Secondary | ICD-10-CM | POA: Insufficient documentation

## 2017-05-08 DIAGNOSIS — B958 Unspecified staphylococcus as the cause of diseases classified elsewhere: Secondary | ICD-10-CM | POA: Insufficient documentation

## 2017-05-08 DIAGNOSIS — J45909 Unspecified asthma, uncomplicated: Secondary | ICD-10-CM | POA: Insufficient documentation

## 2017-05-08 DIAGNOSIS — F17210 Nicotine dependence, cigarettes, uncomplicated: Secondary | ICD-10-CM | POA: Insufficient documentation

## 2017-05-08 MED ORDER — PENTAFLUOROPROP-TETRAFLUOROETH EX AERO: INHALATION_SPRAY | Freq: Once | CUTANEOUS | Status: DC

## 2017-05-08 MED ORDER — SULFAMETHOXAZOLE-TRIMETHOPRIM 800-160 MG PO TABS: 1.0000 | ORAL_TABLET | Freq: Two times a day (BID) | ORAL | 0 refills | Status: AC

## 2017-05-08 MED ORDER — HYDROCODONE-ACETAMINOPHEN 5-325 MG PO TABS: 1.0000 | ORAL_TABLET | Freq: Four times a day (QID) | ORAL | 0 refills | Status: DC | PRN

## 2017-05-08 NOTE — ED Provider Notes (Signed)
[facility] Emergency Department Provider Note   ____________________________________________   I have reviewed the triage vital signs and the nursing notes.   HISTORY  Chief Complaint Hand Pain    HPI Manuel Adams is a 26 y.o. male presents to the emergency department with pain, erythema and swelling along the nail bed of the left middle digit that has worsened over the last 4-5 days.  Patient reports a similar symptom occurring with his right thumb approximately 1 year ago while he was incarcerated.  Patient described a skin infection scenario with his right thumb although not confirmed.  Patient reports taking antibiotics and ibuprofen for the right thumb wound.  Patient denies sustaining an injury, hangnail or biting his fingernails to calls the above symptoms.  The skin appears intact although there appears to be an area of fluctuance in the area of the erythema and swelling.  Patient demonstrates intact movement and sensation of the right middle digit and he denies any recent trauma. Patient denies fever, chills, headache, vision changes, chest pain, chest tightness, shortness of breath, abdominal pain, nausea and vomiting.  Past Medical History:  Diagnosis Date  . Asthma     There are no active problems to display for this patient.   Past Surgical History:  Procedure Laterality Date  . arm surgery      Prior to Admission medications   Medication Sig Start Date End Date Taking? Authorizing Provider  albuterol (PROVENTIL HFA;VENTOLIN HFA) 108 (90 BASE) MCG/ACT inhaler Inhale 2 puffs into the lungs every 6 (six) hours as needed for wheezing or shortness of breath. 04/05/15   Governor RooksLord, Rebecca, MD  EPINEPHrine 0.3 mg/0.3 mL IJ SOAJ injection Inject 0.3 mLs (0.3 mg total) into the muscle once. 09/08/15   Sharyn CreamerQuale, Mark, MD  HYDROcodone-acetaminophen (NORCO/VICODIN) 5-325 MG tablet Take 1 tablet every 6 (six) hours as needed by mouth for moderate pain. 05/08/17    Little, Traci M, PA-C  penciclovir (DENAVIR) 1 % cream Apply 1 application topically every 2 (two) hours. 01/08/17   Joni ReiningSmith, Ronald K, PA-C  predniSONE (DELTASONE) 20 MG tablet Take 2 tablets (40 mg total) by mouth daily. 09/08/15   Sharyn CreamerQuale, Mark, MD  sulfamethoxazole-trimethoprim (BACTRIM DS,SEPTRA DS) 800-160 MG tablet Take 1 tablet 2 (two) times daily for 7 days by mouth. 05/08/17 05/15/17  Little, Traci M, PA-C    Allergies Peanut-containing drug products  History reviewed. No pertinent family history.  Social History Social History   Tobacco Use  . Smoking status: Current Every Day Smoker    Packs/day: 0.50    Types: Cigarettes  . Smokeless tobacco: Never Used  Substance Use Topics  . Alcohol use: No  . Drug use: Yes    Types: Marijuana    Comment: daily    Review of Systems Constitutional: Negative for fever/chills Eyes: No visual changes. Cardiovascular: Denies chest pain. Respiratory:. Denies shortness of breath. Musculoskeletal: Positive for right middle digit pain. Skin: Negative for rash.  Erythema, pain and swelling along the cuticle area of the right middle digit. Neurological: Negative for headaches.  ____________________________________________   PHYSICAL EXAM:  VITAL SIGNS: ED Triage Vitals  Enc Vitals Group     BP 05/08/17 1519 (!) 141/88     Pulse Rate 05/08/17 1519 87     Resp 05/08/17 1519 18     Temp 05/08/17 1519 97.7 F (36.5 C)     Temp Source 05/08/17 1519 Oral     SpO2 05/08/17 1519 98 %  Weight 05/08/17 1519 230 lb (104.3 kg)     Height 05/08/17 1519 5\' 10"  (1.778 m)     Head Circumference --      Peak Flow --      Pain Score 05/08/17 1518 9     Pain Loc --      Pain Edu? --      Excl. in GC? --     Constitutional: Alert and oriented. Well appearing and in no acute distress.  Eyes: Conjunctivae are normal. PERRL. Head: Normocephalic and atraumatic. Cardiovascular: Normal rate, regular rhythm. Good peripheral  circulation. Respiratory: Normal respiratory effort without tachypnea or retractions. Musculoskeletal: Nontender with normal range of motion in all extremities. Neurologic: Normal speech and language.  Skin:  Skin is warm, dry and intact. No rash noted. Erythema, pain and swelling along the cuticle area of the right middle digit.  Central area of fluctuance.  Consistent with paronychia. Psychiatric: Mood and affect are normal. Speech and behavior are normal. Patient exhibits appropriate insight and judgement.  ____________________________________________   LABS (all labs ordered are listed, but only abnormal results are displayed)  Labs Reviewed - No data to display ____________________________________________  EKG None ____________________________________________  RADIOLOGY None ____________________________________________   PROCEDURES  Procedure(s) performed:  INCISION AND DRAINAGE Performed by: Clois Comberraci M Little Consent: Verbal consent obtained. Risks and benefits: risks, benefits and alternatives were discussed Type: abscess  Body area: Right middle digit  Incision was made with a scalpel.  Local anesthetic: Pain Ease Topical Drainage: purulent  Drainage amount: 2.0 ml   Patient tolerance: Patient tolerated the procedure well with no immediate complications.      Critical Care performed: no ____________________________________________   INITIAL IMPRESSION / ASSESSMENT AND PLAN / ED COURSE  Pertinent labs & imaging results that were available during my care of the patient were reviewed by me and considered in my medical decision making (see chart for details).  Patient presents to emergency department with pain erythema and swelling along cuticle area of the right middle digit has worsened over 4-5 days. History and physical exam findings are consistent with a paronychia.  Paronychia I&D without complications as above.  Patient history significant for skin  infection and incarceration.  Past skin infection unknown if it was MRSA.  Patient will be prescribed Bactrim for antibiotic coverage.  Patient advised to follow up with PCP as needed or return to the emergency department if symptoms return or worsen. Patient informed of clinical course, understand medical decision-making process, and agree with plan. ____________________________________________   FINAL CLINICAL IMPRESSION(S) / ED DIAGNOSES  Final diagnoses:  Paronychia of left middle finger  Staph infection       NEW MEDICATIONS STARTED DURING THIS VISIT:  This SmartLink is deprecated. Use AVSMEDLIST instead to display the medication list for a patient.   Note:  This document was prepared using Dragon voice recognition software and may include unintentional dictation errors.    Clois ComberLittle, Traci M, PA-C 05/08/17 2359    Willy Eddyobinson, Patrick, MD 05/09/17 1925

## 2017-05-08 NOTE — ED Triage Notes (Signed)
Pt from home with pain in left middle finger. States he had similar pain in his right thumb a year or so ago and it went away. Pt describes pain as sharp and "pins and needles." Pt alert & oriented with NAD noted.

## 2017-05-08 NOTE — ED Notes (Signed)
Pt discharged home after verbalizing understanding of discharge instructions; nad noted. 

## 2017-05-08 NOTE — Discharge Instructions (Signed)
Take medication as prescribed.   Return to emergency department if symptoms worsen and follow-up with PCP as needed.    Monitor wound for healing if you note signs of worsening infection do not hesitate to return to the emergency department.

## 2017-11-23 ENCOUNTER — Encounter: Payer: Self-pay | Admitting: Emergency Medicine

## 2017-11-23 DIAGNOSIS — R600 Localized edema: Secondary | ICD-10-CM | POA: Insufficient documentation

## 2017-11-23 DIAGNOSIS — Z79899 Other long term (current) drug therapy: Secondary | ICD-10-CM | POA: Insufficient documentation

## 2017-11-23 DIAGNOSIS — M79644 Pain in right finger(s): Secondary | ICD-10-CM | POA: Insufficient documentation

## 2017-11-23 DIAGNOSIS — J45909 Unspecified asthma, uncomplicated: Secondary | ICD-10-CM | POA: Insufficient documentation

## 2017-11-23 DIAGNOSIS — F17210 Nicotine dependence, cigarettes, uncomplicated: Secondary | ICD-10-CM | POA: Insufficient documentation

## 2017-11-23 NOTE — ED Notes (Signed)
Patient given water at this time.  

## 2017-11-23 NOTE — ED Triage Notes (Signed)
Patient states that he woke up this morning with swelling in right thumb.

## 2017-11-24 ENCOUNTER — Emergency Department: Payer: Self-pay

## 2017-11-24 ENCOUNTER — Emergency Department
Admission: EM | Admit: 2017-11-24 | Discharge: 2017-11-24 | Discharge status: Against Medical Advice | Payer: Self-pay | Attending: Emergency Medicine | Admitting: Emergency Medicine

## 2017-11-24 DIAGNOSIS — M7989 Other specified soft tissue disorders: Secondary | ICD-10-CM

## 2017-11-24 DIAGNOSIS — M79644 Pain in right finger(s): Secondary | ICD-10-CM

## 2017-11-24 MED ORDER — TRAMADOL HCL 50 MG PO TABS
50.0000 mg | ORAL_TABLET | Freq: Once | ORAL | Status: AC
  Administered 2017-11-24: 50 mg via ORAL
  Filled 2017-11-24: qty 1

## 2017-11-24 MED ORDER — KETOROLAC TROMETHAMINE 60 MG/2ML IM SOLN
60.0000 mg | Freq: Once | INTRAMUSCULAR | Status: DC
  Filled 2017-11-24: qty 2

## 2017-11-24 MED ORDER — IBUPROFEN 800 MG PO TABS
800.0000 mg | ORAL_TABLET | Freq: Once | ORAL | Status: AC
  Administered 2017-11-24: 800 mg via ORAL
  Filled 2017-11-24: qty 1
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
## 2017-11-24 NOTE — ED Provider Notes (Signed)
[facility] Emergency Department Provider Note   ____________________________________________   First MD Initiated Contact with Patient 11/24/17 0102     (approximate)  I have reviewed the triage vital signs and the nursing notes.   HISTORY  Chief Complaint Hand Pain    HPI Manuel Adams is a 27 y.o. male who comes into the hospital today with some some swelling.  The patient states that he woke up this morning with the swelling but it seems more inflamed now.  The patient did not take any medications before he came in.  He was given ibuprofen in triage.  The patient denies any injury to his thumb.  This is his right hand.  He reports that something similar happened to his middle finger about a year ago on the left and when he came here it was drained and some yellowish material came out.  He also had some swelling to his left thumb over a year ago but he does not remember exactly what happened.  The patient states that he is unable to sleep because of the pain.  He rates his pain a 12 out of 10 in intensity.  He denies any fevers but states that the pain is going into his arm and is giving him a headache.  Past Medical History:  Diagnosis Date  . Asthma     There are no active problems to display for this patient.   Past Surgical History:  Procedure Laterality Date  . arm surgery      Prior to Admission medications   Medication Sig Start Date End Date Taking? Authorizing Provider  albuterol (PROVENTIL HFA;VENTOLIN HFA) 108 (90 BASE) MCG/ACT inhaler Inhale 2 puffs into the lungs every 6 (six) hours as needed for wheezing or shortness of breath. 04/05/15   Governor Rooks, MD  EPINEPHrine 0.3 mg/0.3 mL IJ SOAJ injection Inject 0.3 mLs (0.3 mg total) into the muscle once. 09/08/15   Sharyn Creamer, MD  HYDROcodone-acetaminophen (NORCO/VICODIN) 5-325 MG tablet Take 1 tablet every 6 (six) hours as needed by mouth for moderate pain. 05/08/17   Little, Traci M, PA-C   penciclovir (DENAVIR) 1 % cream Apply 1 application topically every 2 (two) hours. 01/08/17   Joni Reining, PA-C  predniSONE (DELTASONE) 20 MG tablet Take 2 tablets (40 mg total) by mouth daily. 09/08/15   Sharyn Creamer, MD    Allergies Peanut-containing drug products  No family history on file.  Social History Social History   Tobacco Use  . Smoking status: Current Every Day Smoker    Packs/day: 0.50    Types: Cigarettes  . Smokeless tobacco: Never Used  Substance Use Topics  . Alcohol use: No  . Drug use: Yes    Types: Marijuana    Comment: daily    Review of Systems  Constitutional: No fever/chills Eyes: No visual changes. ENT: No sore throat. Cardiovascular: Denies chest pain. Respiratory: Denies shortness of breath. Gastrointestinal: No abdominal pain.  No nausea, no vomiting.  No diarrhea.  No constipation. Genitourinary: Negative for dysuria. Musculoskeletal: Negative for back pain. Skin: Negative for rash. Neurological: Negative for headaches, focal weakness or numbness.   ____________________________________________   PHYSICAL EXAM:  VITAL SIGNS: ED Triage Vitals  Enc Vitals Group     BP 11/23/17 2319 (!) 150/92     Pulse Rate 11/23/17 2319 78     Resp 11/23/17 2319 20     Temp 11/23/17 2319 98.6 F (37 C)     Temp  Source 11/23/17 2319 Oral     SpO2 11/23/17 2319 96 %     Weight 11/23/17 2314 283 lb (128.4 kg)     Height 11/23/17 2314  (1.753 m)     Head Circumference --      Peak Flow --      Pain Score 11/23/17 2314 10     Pain Loc --      Pain Edu? --      Excl. in GC? --     Constitutional: Alert and oriented. Well appearing and in moderate distress. Eyes: Conjunctivae are normal. PERRL. EOMI. Head: Atraumatic. Nose: No congestion/rhinnorhea. Mouth/Throat: Mucous membranes are moist.  Oropharynx non-erythematous. Cardiovascular: Normal rate, regular rhythm. Grossly normal heart sounds.  Good peripheral circulation. Respiratory:  Normal respiratory effort.  No retractions. Lungs CTAB. Gastrointestinal: Soft and nontender. No distention.  Positive bowel sounds Musculoskeletal: Diffuse swelling to right thumb with no distinct paronychia Neurologic:  Normal speech and language.  Skin:  Skin is warm, dry and intact.  Psychiatric: Mood and affect are normal.   ____________________________________________   LABS (all labs ordered are listed, but only abnormal results are displayed)  Labs Reviewed - No data to display ____________________________________________  EKG  none ____________________________________________  RADIOLOGY  ED MD interpretation: Right thumb x-ray  Official radiology report(s): No results found.  ____________________________________________   PROCEDURES  Procedure(s) performed: None  Procedures  Critical Care performed: No  ____________________________________________   INITIAL IMPRESSION / ASSESSMENT AND PLAN / ED COURSE  As part of my medical decision making, I reviewed the following data within the electronic MEDICAL RECORD NUMBER Notes from prior ED visits and [place] Controlled Substance Database   This is a 27 year old male who comes into the hospital today with some right thumb swelling.  The patient has some diffuse thumb swelling with no distinct area of abscess.  He has no bruising.  I will send the patient for an x-ray to evaluate for injury and I will give him a dose of Toradol and tramadol.  The patient received the medication for pain but then refused the x-ray.  The patient eloped prior to any further studies being performed.        ____________________________________________   FINAL CLINICAL IMPRESSION(S) / ED DIAGNOSES  Final diagnoses:  Pain of right thumb  Thumb swelling     ED Discharge Orders    None       Note:  This document was prepared using Dragon voice recognition software and may include unintentional dictation errors.    Rebecka Apley, MD 11/24/17 [phone]

## 2019-01-11 ENCOUNTER — Emergency Department (HOSPITAL_COMMUNITY)
Admission: EM | Admit: 2019-01-11 | Discharge: 2019-01-11 | Disposition: A | Discharge status: Home / Self Care | Payer: Self-pay | Attending: Emergency Medicine | Admitting: Emergency Medicine

## 2019-01-11 ENCOUNTER — Emergency Department (HOSPITAL_COMMUNITY): Payer: Self-pay

## 2019-01-11 ENCOUNTER — Encounter: Payer: Self-pay | Admitting: Emergency Medicine

## 2019-01-11 ENCOUNTER — Encounter (HOSPITAL_COMMUNITY): Payer: Self-pay | Admitting: Emergency Medicine

## 2019-01-11 DIAGNOSIS — R109 Unspecified abdominal pain: Secondary | ICD-10-CM | POA: Insufficient documentation

## 2019-01-11 DIAGNOSIS — Y999 Unspecified external cause status: Secondary | ICD-10-CM | POA: Insufficient documentation

## 2019-01-11 DIAGNOSIS — S71042A Puncture wound with foreign body, left hip, initial encounter: Secondary | ICD-10-CM | POA: Insufficient documentation

## 2019-01-11 DIAGNOSIS — W3400XA Accidental discharge from unspecified firearms or gun, initial encounter: Secondary | ICD-10-CM

## 2019-01-11 DIAGNOSIS — Y929 Unspecified place or not applicable: Secondary | ICD-10-CM | POA: Insufficient documentation

## 2019-01-11 DIAGNOSIS — Y9389 Activity, other specified: Secondary | ICD-10-CM | POA: Insufficient documentation

## 2019-01-11 DIAGNOSIS — Z20828 Contact with and (suspected) exposure to other viral communicable diseases: Secondary | ICD-10-CM | POA: Insufficient documentation

## 2019-01-11 DIAGNOSIS — E669 Obesity, unspecified: Secondary | ICD-10-CM | POA: Insufficient documentation

## 2019-01-11 LAB — COMPREHENSIVE METABOLIC PANEL
ALT: 24 U/L (ref 0–44)
AST: 32 U/L (ref 15–41)
Albumin: 3.7 g/dL (ref 3.5–5.0)
Alkaline Phosphatase: 80 U/L (ref 38–126)
Anion gap: 12 (ref 5–15)
BUN: 10 mg/dL (ref 6–20)
CO2: 20 mmol/L — ABNORMAL LOW (ref 22–32)
Calcium: 9.1 mg/dL (ref 8.9–10.3)
Chloride: 108 mmol/L (ref 98–111)
Creatinine, Ser: 1.19 mg/dL (ref 0.61–1.24)
GFR calc Af Amer: >60 mL/min (ref >60)
GFR calc non Af Amer: >60 mL/min (ref >60)
Glucose, Bld: 141 mg/dL — ABNORMAL HIGH (ref 70–99)
Potassium: 3.7 mmol/L (ref 3.5–5.1)
Sodium: 140 mmol/L (ref 135–145)
Total Bilirubin: 0.7 mg/dL (ref 0.3–1.2)
Total Protein: 6.7 g/dL (ref 6.5–8.1)

## 2019-01-11 LAB — I-STAT CHEM 8, ED
BUN: 10 mg/dL (ref 6–20)
Calcium, Ion: 1.03 mmol/L — ABNORMAL LOW (ref 1.15–1.40)
Chloride: 107 mmol/L (ref 98–111)
Creatinine, Ser: 1.1 mg/dL (ref 0.61–1.24)
Glucose, Bld: 137 mg/dL — ABNORMAL HIGH (ref 70–99)
HCT: 48 % (ref 39.0–52.0)
Hemoglobin: 16.3 g/dL (ref 13.0–17.0)
Potassium: 3.5 mmol/L (ref 3.5–5.1)
Sodium: 140 mmol/L (ref 135–145)
TCO2: 20 mmol/L — ABNORMAL LOW (ref 22–32)

## 2019-01-11 LAB — ETHANOL: Alcohol, Ethyl (B): <10 mg/dL (ref <10)

## 2019-01-11 LAB — PREPARE FRESH FROZEN PLASMA
Unit division: 0
Unit division: 0

## 2019-01-11 LAB — PROTIME-INR
INR: 1 (ref 0.8–1.2)
Prothrombin Time: 13.3 seconds (ref 11.4–15.2)

## 2019-01-11 LAB — CBC
HCT: 47.2 % (ref 39.0–52.0)
Hemoglobin: 15.6 g/dL (ref 13.0–17.0)
MCH: 30.9 pg (ref 26.0–34.0)
MCHC: 33.1 g/dL (ref 30.0–36.0)
MCV: 93.5 fL (ref 80.0–100.0)
Platelets: 212 10*3/uL (ref 150–400)
RBC: 5.05 MIL/uL (ref 4.22–5.81)
RDW: 12.6 % (ref 11.5–15.5)
WBC: 11.3 10*3/uL — ABNORMAL HIGH (ref 4.0–10.5)
nRBC: 0 % (ref 0.0–0.2)

## 2019-01-11 LAB — CDS SEROLOGY

## 2019-01-11 LAB — BPAM FFP
Blood Product Expiration Date: 202007132359
Blood Product Expiration Date: 202007302359
ISSUE DATE / TIME: 202007130009
ISSUE DATE / TIME: 202007130009
Unit Type and Rh: 600
Unit Type and Rh: 6200

## 2019-01-11 LAB — ABO/RH: ABO/RH(D): B POS

## 2019-01-11 LAB — LACTIC ACID, PLASMA: Lactic Acid, Venous: 3.3 mmol/L (ref 0.5–1.9)

## 2019-01-11 LAB — SARS CORONAVIRUS 2 BY RT PCR (HOSPITAL ORDER, PERFORMED IN ~~LOC~~ HOSPITAL LAB): SARS Coronavirus 2: NEGATIVE

## 2019-01-11 MED ORDER — DICLOFENAC SODIUM ER 100 MG PO TB24: 100.0000 mg | ORAL_TABLET | Freq: Every day | ORAL | 0 refills | Status: AC

## 2019-01-11 MED ORDER — HYDROMORPHONE HCL 1 MG/ML IJ SOLN
1.0000 mg | Freq: Once | INTRAMUSCULAR | Status: AC
  Administered 2019-01-11: 1 mg via INTRAVENOUS

## 2019-01-11 MED ORDER — MUPIROCIN CALCIUM 2 % EX CREA: 1.0000 "application " | TOPICAL_CREAM | Freq: Two times a day (BID) | CUTANEOUS | 0 refills | Status: DC

## 2019-01-11 MED ORDER — CEFAZOLIN SODIUM-DEXTROSE 1-4 GM/50ML-% IV SOLN
1.0000 g | Freq: Once | INTRAVENOUS | Status: AC
  Administered 2019-01-11: 02:00:00 1 g via INTRAVENOUS
  Filled 2019-01-11: qty 50

## 2019-01-11 MED ORDER — IOHEXOL 300 MG/ML  SOLN
100.0000 mL | Freq: Once | INTRAMUSCULAR | Status: AC | PRN
  Administered 2019-01-11: 100 mL via INTRAVENOUS

## 2019-01-11 MED ORDER — SODIUM CHLORIDE 0.9 % IV SOLN: Freq: Once | INTRAVENOUS | Status: DC

## 2019-01-11 MED ORDER — KETOROLAC TROMETHAMINE 15 MG/ML IJ SOLN
15.0000 mg | Freq: Once | INTRAMUSCULAR | Status: AC
  Administered 2019-01-11: 02:00:00 15 mg via INTRAVENOUS
  Filled 2019-01-11: qty 1

## 2019-01-11 MED ORDER — SODIUM CHLORIDE 0.9 % IV SOLN
Freq: Once | INTRAVENOUS | Status: AC
  Administered 2019-01-11: via INTRAVENOUS

## 2019-01-11 MED ORDER — CEPHALEXIN 500 MG PO CAPS: 500.0000 mg | ORAL_CAPSULE | Freq: Four times a day (QID) | ORAL | 0 refills | Status: DC

## 2019-01-11 MED ORDER — FENTANYL CITRATE (PF) 100 MCG/2ML IJ SOLN: 100.0000 ug | Freq: Once | INTRAMUSCULAR | Status: DC

## 2019-01-11 MED ORDER — HYDROMORPHONE HCL 1 MG/ML IJ SOLN
INTRAMUSCULAR | Status: AC
  Administered 2019-01-11: 01:00:00 1 mg via INTRAVENOUS
  Filled 2019-01-11: qty 1

## 2019-01-11 MED ORDER — TETANUS-DIPHTH-ACELL PERTUSSIS 5-2.5-18.5 LF-MCG/0.5 IM SUSP
0.5000 mL | Freq: Once | INTRAMUSCULAR | Status: DC
  Filled 2019-01-11: qty 0.5
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
## 2019-01-11 NOTE — ED Notes (Signed)
To c-t at [place]  From [place]

## 2019-01-12 LAB — TYPE AND SCREEN
ABO/RH(D): B POS
Antibody Screen: NEGATIVE
Unit division: 0
Unit division: 0

## 2019-01-12 LAB — BPAM RBC
Blood Product Expiration Date: 202007172359
Blood Product Expiration Date: 202007182359
ISSUE DATE / TIME: 202007130009
ISSUE DATE / TIME: 202007130009
Unit Type and Rh: 9500
Unit Type and Rh: 9500

## 2019-03-19 ENCOUNTER — Other Ambulatory Visit: Payer: Self-pay

## 2019-03-19 ENCOUNTER — Emergency Department
Admission: EM | Admit: 2019-03-19 | Discharge: 2019-03-19 | Disposition: A | Discharge status: Home / Self Care | Payer: Self-pay | Attending: Emergency Medicine | Admitting: Emergency Medicine

## 2019-03-19 DIAGNOSIS — M25552 Pain in left hip: Secondary | ICD-10-CM | POA: Insufficient documentation

## 2019-03-19 DIAGNOSIS — Z5321 Procedure and treatment not carried out due to patient leaving prior to being seen by health care provider: Secondary | ICD-10-CM | POA: Insufficient documentation

## 2019-03-19 NOTE — ED Provider Notes (Signed)
Patient presents emergency department complaining of left hip pain.  Patient was triaged on arrival.  Prior to patient being placed in room, less than 30 minutes after triage, patient had left without being seen.  Patient did not inform staff he was leaving.  I did not see or treat the patient at this time.   Brynda Peon 03/19/19 2257    Blake Divine, MD 03/20/19 [phone]

## 2019-03-19 NOTE — ED Notes (Signed)
Patient called multiple times by [facility] RN with no answer. Patient called by this RN with no answer. Patient not seen in lobby.
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
## 2019-03-22 ENCOUNTER — Other Ambulatory Visit: Payer: Self-pay

## 2019-03-22 ENCOUNTER — Encounter: Payer: Self-pay | Admitting: Emergency Medicine

## 2019-03-22 ENCOUNTER — Emergency Department
Admission: EM | Admit: 2019-03-22 | Discharge: 2019-03-22 | Disposition: A | Discharge status: Home / Self Care | Payer: Self-pay | Attending: Emergency Medicine | Admitting: Emergency Medicine

## 2019-03-22 DIAGNOSIS — F17210 Nicotine dependence, cigarettes, uncomplicated: Secondary | ICD-10-CM | POA: Insufficient documentation

## 2019-03-22 DIAGNOSIS — J45909 Unspecified asthma, uncomplicated: Secondary | ICD-10-CM | POA: Insufficient documentation

## 2019-03-22 DIAGNOSIS — F1210 Cannabis abuse, uncomplicated: Secondary | ICD-10-CM | POA: Insufficient documentation

## 2019-03-22 DIAGNOSIS — M79605 Pain in left leg: Secondary | ICD-10-CM | POA: Insufficient documentation

## 2019-03-22 MED ORDER — MELOXICAM 15 MG PO TABS: 15.0000 mg | ORAL_TABLET | Freq: Every day | ORAL | 1 refills | Status: AC
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
## 2019-12-22 ENCOUNTER — Emergency Department
Admission: EM | Admit: 2019-12-22 | Discharge: 2019-12-22 | Disposition: A | Discharge status: Home / Self Care | Payer: No Typology Code available for payment source | Attending: Emergency Medicine | Admitting: Emergency Medicine

## 2019-12-22 ENCOUNTER — Encounter: Payer: Self-pay | Admitting: Emergency Medicine

## 2019-12-22 ENCOUNTER — Other Ambulatory Visit: Payer: Self-pay

## 2019-12-22 ENCOUNTER — Emergency Department: Payer: No Typology Code available for payment source

## 2019-12-22 DIAGNOSIS — F17210 Nicotine dependence, cigarettes, uncomplicated: Secondary | ICD-10-CM | POA: Diagnosis not present

## 2019-12-22 DIAGNOSIS — Y92410 Unspecified street and highway as the place of occurrence of the external cause: Secondary | ICD-10-CM | POA: Insufficient documentation

## 2019-12-22 DIAGNOSIS — Y999 Unspecified external cause status: Secondary | ICD-10-CM | POA: Diagnosis not present

## 2019-12-22 DIAGNOSIS — S39012A Strain of muscle, fascia and tendon of lower back, initial encounter: Secondary | ICD-10-CM | POA: Diagnosis not present

## 2019-12-22 DIAGNOSIS — J45909 Unspecified asthma, uncomplicated: Secondary | ICD-10-CM | POA: Insufficient documentation

## 2019-12-22 DIAGNOSIS — Y939 Activity, unspecified: Secondary | ICD-10-CM | POA: Diagnosis not present

## 2019-12-22 DIAGNOSIS — Z91010 Allergy to peanuts: Secondary | ICD-10-CM | POA: Insufficient documentation

## 2019-12-22 DIAGNOSIS — S3992XA Unspecified injury of lower back, initial encounter: Secondary | ICD-10-CM | POA: Diagnosis present

## 2019-12-22 MED ORDER — IBUPROFEN 600 MG PO TABS
600.0000 mg | ORAL_TABLET | Freq: Three times a day (TID) | ORAL | 0 refills | Status: AC | PRN
Start: 2019-12-22 — End: ?

## 2019-12-22 MED ORDER — METHOCARBAMOL 500 MG PO TABS
1000.0000 mg | ORAL_TABLET | Freq: Once | ORAL | Status: AC
  Administered 2019-12-22: 1000 mg via ORAL
  Filled 2019-12-22: qty 2

## 2019-12-22 MED ORDER — HYDROCODONE-ACETAMINOPHEN 5-325 MG PO TABS: 1.0000 | ORAL_TABLET | Freq: Four times a day (QID) | ORAL | 0 refills | Status: AC | PRN

## 2019-12-22 MED ORDER — KETOROLAC TROMETHAMINE 30 MG/ML IJ SOLN
30.0000 mg | Freq: Once | INTRAMUSCULAR | Status: AC
  Administered 2019-12-22: 30 mg via INTRAMUSCULAR
  Filled 2019-12-22: qty 1

## 2019-12-22 MED ORDER — METHOCARBAMOL 500 MG PO TABS: ORAL_TABLET | ORAL | 0 refills | Status: AC

## 2019-12-22 MED ORDER — METHOCARBAMOL 500 MG PO TABS: ORAL_TABLET | ORAL | 0 refills | Status: DC
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
## 2019-12-22 NOTE — Discharge Instructions (Signed)
Follow-up with your primary care provider if any continued problems.  Use ice or heat to your muscles as needed for discomfort.  Begin taking ibuprofen 600 mg every 8 hours as needed for pain and inflammation.  The methocarbamol is 1 or 2 tablets every 6 hours as needed for muscle spasms.  Cannot drive or operate machinery while taking this medication along with the hydrocodone that was sent if needed for pain.  Continue to move around as much as possible to avoid stiffness.

## 2021-05-28 ENCOUNTER — Emergency Department (HOSPITAL_COMMUNITY)
Admission: EM | Admit: 2021-05-28 | Discharge: 2021-05-28 | Disposition: A | Discharge status: Home / Self Care | Payer: Self-pay | Attending: Emergency Medicine | Admitting: Emergency Medicine

## 2021-05-28 ENCOUNTER — Other Ambulatory Visit: Payer: Self-pay

## 2021-05-28 ENCOUNTER — Encounter (HOSPITAL_COMMUNITY): Payer: Self-pay | Admitting: *Deleted

## 2021-05-28 DIAGNOSIS — T7840XA Allergy, unspecified, initial encounter: Secondary | ICD-10-CM | POA: Insufficient documentation

## 2021-05-28 DIAGNOSIS — F17210 Nicotine dependence, cigarettes, uncomplicated: Secondary | ICD-10-CM | POA: Insufficient documentation

## 2021-05-28 DIAGNOSIS — Z91010 Allergy to peanuts: Secondary | ICD-10-CM | POA: Insufficient documentation

## 2021-05-28 DIAGNOSIS — J45909 Unspecified asthma, uncomplicated: Secondary | ICD-10-CM | POA: Insufficient documentation

## 2021-05-28 MED ORDER — DIPHENHYDRAMINE HCL 50 MG/ML IJ SOLN
25.0000 mg | Freq: Once | INTRAMUSCULAR | Status: AC
  Administered 2021-05-28: 11:00:00 25 mg via INTRAVENOUS
  Filled 2021-05-28: qty 1

## 2021-05-28 MED ORDER — EPINEPHRINE 0.3 MG/0.3ML IJ SOAJ: 0.3000 mg | Freq: Once | INTRAMUSCULAR | 0 refills | Status: AC

## 2021-05-28 MED ORDER — SODIUM CHLORIDE 0.9 % IV BOLUS
1000.0000 mL | Freq: Once | INTRAVENOUS | Status: AC
  Administered 2021-05-28: 11:00:00 1000 mL via INTRAVENOUS

## 2021-05-28 MED ORDER — FAMOTIDINE IN NACL 20-0.9 MG/50ML-% IV SOLN
20.0000 mg | Freq: Once | INTRAVENOUS | Status: AC
  Administered 2021-05-28: 11:00:00 20 mg via INTRAVENOUS
  Filled 2021-05-28: qty 50

## 2021-05-28 MED ORDER — EPINEPHRINE 0.3 MG/0.3ML IJ SOAJ
0.3000 mg | Freq: Once | INTRAMUSCULAR | Status: AC
  Administered 2021-05-28: 11:00:00 0.3 mg via INTRAMUSCULAR
  Filled 2021-05-28: qty 0.3

## 2021-05-28 MED ORDER — METHYLPREDNISOLONE SODIUM SUCC 125 MG IJ SOLR
125.0000 mg | Freq: Once | INTRAMUSCULAR | Status: AC
  Administered 2021-05-28: 11:00:00 125 mg via INTRAVENOUS
  Filled 2021-05-28: qty 2

## 2021-05-28 NOTE — ED Provider Notes (Signed)
[facility] EMERGENCY DEPARTMENT Provider Note   CSN: [id number] Arrival date & time: 05/28/21  5176     History Chief Complaint  Patient presents with   Allergic Reaction    Manuel Adams is a 30 y.o. male.   Allergic Reaction Presenting symptoms: no rash and no wheezing   Patient sent in for possible allergic reaction.  Reportedly difficulty breathing.  Face swelling.  Muscle aches.  Began acutely.  History of allergies and wonders if he ate something with peanut oil.  No relief with Benadryl.  States it feels as if his head is plugged up and some difficulty breathing through his nose.    Past Medical History:  Diagnosis Date   Asthma     There are no problems to display for this patient.   Past Surgical History:  Procedure Laterality Date   arm surgery         No family history on file.  Social History   Tobacco Use   Smoking status: Every Day    Packs/day: 1.00    Types: Cigarettes   Smokeless tobacco: Never  Vaping Use   Vaping Use: Never used  Substance Use Topics   Alcohol use: Yes   Drug use: Yes    Types: Marijuana    Comment: daily    Home Medications Prior to Admission medications   Medication Sig Start Date End Date Taking? Authorizing Provider  albuterol (PROVENTIL HFA;VENTOLIN HFA) 108 (90 BASE) MCG/ACT inhaler Inhale 2 puffs into the lungs every 6 (six) hours as needed for wheezing or shortness of breath. Patient not taking: Reported on 05/28/2021 04/05/15   Governor Rooks, MD  Diclofenac Sodium CR 100 MG 24 hr tablet Take 1 tablet (100 mg total) by mouth daily. Patient not taking: Reported on 05/28/2021 01/11/19   Palumbo, April, MD  HYDROcodone-acetaminophen (NORCO) 5-325 MG tablet Take 1 tablet by mouth every 6 (six) hours as needed for moderate pain. Patient not taking: Reported on 05/28/2021 12/22/19   Tommi Rumps, PA-C  ibuprofen (ADVIL) 600 MG tablet Take 1 tablet (600 mg total) by mouth every 8 (eight) hours as  needed. Patient not taking: Reported on 05/28/2021 12/22/19   Tommi Rumps, PA-C  methocarbamol (ROBAXIN) 500 MG tablet 1-2 tablets every 6 hours prn muscle spasms. Patient not taking: Reported on 05/28/2021 12/22/19   Tommi Rumps, PA-C  penciclovir [facility]) 1 % cream Apply 1 application topically every 2 (two) hours. Patient not taking: Reported on 05/28/2021 01/08/17   Joni Reining, PA-C    Allergies    Peanut-containing drug products  Review of Systems   Review of Systems  Constitutional:  Negative for appetite change and fever.  HENT:  Positive for congestion and facial swelling.   Respiratory:  Positive for shortness of breath and stridor. Negative for wheezing.   Cardiovascular:  Negative for chest pain.  Gastrointestinal:  Negative for abdominal pain.  Genitourinary:  Negative for flank pain.  Musculoskeletal:  Negative for back pain.  Skin:  Negative for rash.  Psychiatric/Behavioral:  Negative for confusion.    Physical Exam Updated Vital Signs BP 118/77 (BP Location: Left Arm)   Pulse 79   Temp 98.4 F (36.9 C) (Oral)   Resp 18   Ht 5\' 10"  (1.778 m)   Wt 117.9 kg   SpO2 98%   BMI 37.30 kg/m   Physical Exam Vitals and nursing note reviewed.  HENT:     Head: Atraumatic.     Comments:  Mild edema of his face.    Nose:     Comments: Nasal congestion.    Mouth/Throat:     Pharynx: No oropharyngeal exudate or posterior oropharyngeal erythema.     Comments: No edema. Eyes:     Pupils: Pupils are equal, round, and reactive to light.  Cardiovascular:     Rate and Rhythm: Regular rhythm.  Pulmonary:     Comments: Some possible stridor.  No frank wheezes.  No respiratory distress. Abdominal:     Tenderness: There is no abdominal tenderness.  Musculoskeletal:        General: No tenderness.     Cervical back: Neck supple.  Skin:    General: Skin is warm.     Capillary Refill: Capillary refill takes less than 2 seconds.  Neurological:     Mental  Status: He is alert and oriented to person, place, and time.    ED Results / Procedures / Treatments   Labs (all labs ordered are listed, but only abnormal results are displayed) Labs Reviewed - No data to display  EKG None  Radiology No results found.  Procedures Procedures   Medications Ordered in ED Medications  EPINEPHrine (EPI-PEN) injection 0.3 mg (0.3 mg Intramuscular Given 05/28/21 1043)  methylPREDNISolone sodium succinate (SOLU-MEDROL) 125 mg/2 mL injection 125 mg (125 mg Intravenous Given 05/28/21 1043)  famotidine (PEPCID) IVPB 20 mg premix (0 mg Intravenous Stopped 05/28/21 1113)  diphenhydrAMINE (BENADRYL) injection 25 mg (25 mg Intravenous Given 05/28/21 1043)  sodium chloride 0.9 % bolus 1,000 mL (0 mLs Intravenous Stopped 05/28/21 1254)    ED Course  I have reviewed the triage vital signs and the nursing notes.  Pertinent labs & imaging results that were available during my care of the patient were reviewed by me and considered in my medical decision making (see chart for details).    MDM Rules/Calculators/A&P                           Patient brought in with likely allergic reaction.  A thump and then had swelling in his face.  Itchiness.  Some difficulty breathing with mild stridor on exam.  Has been monitored for around 4-1/2 hours.  Had been given epi antihistamines and steroids.  Feels better.  Appears stable for discharge home.  Follow-up with allergy.  Given prescription for EpiPen.  Discharge home. Final Clinical Impression(s) / ED Diagnoses Final diagnoses:  Allergic reaction, initial encounter    Rx / DC Orders ED Discharge Orders          Ordered    EPINEPHrine 0.3 mg/0.3 mL IJ SOAJ injection   Once        05/28/21 1411             Benjiman Core, MD 05/29/21 [phone]

## 2021-05-28 NOTE — ED Notes (Signed)
Dc instructions reviewed with pt. Pt will follow up with pcp for epi pen. Pt denies any pain or discomfort. Ambulated to lobby to meet family for ride home.

## 2021-05-28 NOTE — ED Triage Notes (Signed)
Pt c/o difficulty breathing, generalized itching, feeling hot since he ate grits this morning. Pt reports he is allergic to peanut oil but doesn't believe he has come into contact with it. Pt took 2 Benadryl PTA with no relief in symptoms.
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
## 2021-09-28 ENCOUNTER — Other Ambulatory Visit: Payer: Self-pay

## 2021-09-28 ENCOUNTER — Emergency Department (HOSPITAL_COMMUNITY)
Admission: EM | Admit: 2021-09-28 | Discharge: 2021-09-28 | Disposition: A | Discharge status: Home / Self Care | Payer: Self-pay | Attending: Emergency Medicine | Admitting: Emergency Medicine

## 2021-09-28 ENCOUNTER — Encounter (HOSPITAL_COMMUNITY): Payer: Self-pay | Admitting: Emergency Medicine

## 2021-09-28 DIAGNOSIS — Z91010 Allergy to peanuts: Secondary | ICD-10-CM | POA: Insufficient documentation

## 2021-09-28 DIAGNOSIS — G8929 Other chronic pain: Secondary | ICD-10-CM | POA: Insufficient documentation

## 2021-09-28 DIAGNOSIS — M5442 Lumbago with sciatica, left side: Secondary | ICD-10-CM | POA: Insufficient documentation

## 2021-09-28 MED ORDER — CYCLOBENZAPRINE HCL 10 MG PO TABS
10.0000 mg | ORAL_TABLET | Freq: Once | ORAL | Status: AC
  Administered 2021-09-28: 10 mg via ORAL
  Filled 2021-09-28: qty 1

## 2021-09-28 MED ORDER — GABAPENTIN 100 MG PO CAPS: 100.0000 mg | ORAL_CAPSULE | Freq: Three times a day (TID) | ORAL | 0 refills | Status: AC
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
## 2021-09-28 NOTE — Discharge Instructions (Signed)
Please take Ibuprofen (Advil, motrin) and Tylenol (acetaminophen) to relieve your pain.    You may take up to 600 MG (3 pills) of normal strength ibuprofen every 8 hours as needed.   You make take tylenol, up to 1,000 mg (two extra strength pills) every 8 hours as needed.   It is safe to take ibuprofen and tylenol at the same time as they work differently.   Do not take more than 3,000 mg tylenol in a 24 hour period (not more than one dose every 8 hours.  Please check all medication labels as many medications such as pain and cold medications may contain tylenol.  Do not drink alcohol while taking these medications.  Do not take other NSAID'S while taking ibuprofen (such as aleve or naproxen).  Please take ibuprofen with food to decrease stomach upset.  Today you received medications that may make you sleepy or impair your ability to make decisions.  For the next 24 hours please do not drive, operate heavy machinery, care for a small child with out another adult present, or perform any activities that may cause harm to you or someone else if you were to fall asleep or be impaired.   You are being prescribed a medication which may make you sleepy. Please follow up of listed precautions for at least 24 hours after taking one dose.  
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
## 2021-10-02 ENCOUNTER — Encounter (HOSPITAL_COMMUNITY): Payer: Self-pay | Admitting: *Deleted

## 2021-10-02 ENCOUNTER — Emergency Department (HOSPITAL_COMMUNITY)
Admission: EM | Admit: 2021-10-02 | Discharge: 2021-10-02 | Disposition: A | Discharge status: Home / Self Care | Payer: Self-pay | Attending: Emergency Medicine | Admitting: Emergency Medicine

## 2021-10-02 ENCOUNTER — Emergency Department (HOSPITAL_COMMUNITY): Payer: Self-pay

## 2021-10-02 DIAGNOSIS — Z91010 Allergy to peanuts: Secondary | ICD-10-CM | POA: Insufficient documentation

## 2021-10-02 DIAGNOSIS — L03011 Cellulitis of right finger: Secondary | ICD-10-CM | POA: Insufficient documentation

## 2021-10-02 NOTE — Discharge Instructions (Signed)
Soak area 20 minutes 4 times a day °

## 2021-10-02 NOTE — ED Triage Notes (Signed)
Right ring finger swelling , no known injury ?

## 2021-10-04 NOTE — ED Provider Notes (Signed)
?[facility] DEPART[facility] ?Provider Note ? ? ?CSN: [id number] ?Arrival date & time: 10/02/21  1716 ? ?  ? ?History ? ?Chief Complaint  ?Patient presents with  ? Finger Injury  ? ? ?Manuel Adams is a 31 y.o. male. ? ?Pt complains of an infection to his finger  ? ?The history is provided by the patient. No language interpreter was used.  ?Hand Pain ?This is a new problem. The current episode started 2 days ago. The problem occurs constantly. The problem has been gradually worsening. Nothing aggravates the symptoms. Nothing relieves the symptoms. He has tried nothing for the symptoms. The treatment provided no relief.  ? ?  ? ?Home Medications ?Prior to Admission medications   ?Medication Sig Start Date End Date Taking? Authorizing Provider  ?albuterol (PROVENTIL HFA;VENTOLIN HFA) 108 (90 BASE) MCG/ACT inhaler Inhale 2 puffs into the lungs every 6 (six) hours as needed for wheezing or shortness of breath. ?Patient not taking: Reported on 05/28/2021 04/05/15   Governor Rooks, MD  ?Diclofenac Sodium CR 100 MG 24 hr tablet Take 1 tablet (100 mg total) by mouth daily. ?Patient not taking: Reported on 05/28/2021 01/11/19   Palumbo, April, MD  ?gabapentin (NEURONTIN) 100 MG capsule Take 1 capsule (100 mg total) by mouth 3 (three) times daily for 4 days. 09/28/21 10/02/21  Cristina Gong, PA-C  ?HYDROcodone-acetaminophen (NORCO) 5-325 MG tablet Take 1 tablet by mouth every 6 (six) hours as needed for moderate pain. ?Patient not taking: Reported on 05/28/2021 12/22/19   Tommi Rumps, PA-C  ?ibuprofen (ADVIL) 600 MG tablet Take 1 tablet (600 mg total) by mouth every 8 (eight) hours as needed. ?Patient not taking: Reported on 05/28/2021 12/22/19   Tommi Rumps, PA-C  ?methocarbamol (ROBAXIN) 500 MG tablet 1-2 tablets every 6 hours prn muscle spasms. ?Patient not taking: Reported on 05/28/2021 12/22/19   Tommi Rumps, PA-C  ?penciclovir (DENAVIR) 1 % cream Apply 1 application topically every 2 (two)  hours. ?Patient not taking: Reported on 05/28/2021 01/08/17   Joni Reining, PA-C  ?   ? ?Allergies    ?Peanut-containing drug products   ? ?Review of Systems   ?Review of Systems  ?Musculoskeletal:  Positive for joint swelling.  ?All other systems reviewed and are negative. ? ?Physical Exam ?Updated Vital Signs ?BP 120/72   Pulse 91   Temp 98.8 ?F (37.1 ?C) (Oral)   Resp 16   Ht 5\' 10"  (1.778 m)   Wt (!) 138.4 kg   SpO2 98%   BMI 43.78 kg/m?  ?Physical Exam ?Vitals and nursing note reviewed.  ?Constitutional:   ?   Appearance: He is well-developed.  ?HENT:  ?   Head: Normocephalic.  ?Pulmonary:  ?   Effort: Pulmonary effort is normal.  ?Abdominal:  ?   General: There is no distension.  ?Musculoskeletal:     ?   General: Normal range of motion.  ?   Cervical back: Normal range of motion.  ?   Comments: Swelling lateral aspect of left ring finger, green pustule  ?Neurological:  ?   Mental Status: He is alert and oriented to person, place, and time.  ? ? ?ED Results / Procedures / Treatments   ?Labs ?(all labs ordered are listed, but only abnormal results are displayed) ?Labs Reviewed - No data to display ? ?EKG ?None ? ?Radiology ?DG Finger Ring Right ? ?Result Date: 10/02/2021 ?CLINICAL DATA:  Finger swelling. EXAM: RIGHT RING FINGER 2+V COMPARISON:  None. FINDINGS: There  is no evidence of fracture or dislocation. There is no evidence of arthropathy or other focal bone abnormality. Soft tissues are unremarkable. IMPRESSION: Negative. Electronically Signed   By: Darliss Cheney M.D.   On: 10/02/2021 18:12   ? ?Procedures ?Marland Kitchen.Incision and Drainage ? ?Date/Time: 10/04/2021 4:12 PM ?Performed by: Elson Areas, PA-C ?Authorized by: Elson Areas, PA-C  ? ?Consent:  ?  Consent obtained:  Verbal ?  Consent given by:  Patient ?  Risks, benefits, and alternatives were discussed: yes   ?  Risks discussed:  Incomplete drainage ?  Alternatives discussed:  No treatment ?Universal protocol:  ?  Procedure explained and  questions answered to patient or proxy's satisfaction: yes   ?  Immediately prior to procedure, a time out was called: yes   ?  Patient identity confirmed:  Verbally with patient ?Location:  ?  Type:  Abscess ?  Size:  0.3 ?  Location:  Upper extremity ?  Upper extremity location:  Finger ?  Finger location:  R ring finger ?Pre-procedure details:  ?  Skin preparation:  Chlorhexidine with alcohol ?Anesthesia:  ?  Anesthesia method:  None ?Procedure type:  ?  Complexity:  Simple ?Procedure details:  ?  Incision types:  Single straight ?  Drainage:  Purulent ?  Wound treatment:  Drain placed ?Post-procedure details:  ?  Procedure completion:  Tolerated  ? ? ?Medications Ordered in ED ?Medications - No data to display ? ?ED Course/ Medical Decision Making/ A&P ?  ?                        ?Medical Decision Making ?Amount and/or Complexity of Data Reviewed ?Radiology: ordered. ? ? ?MDM:  Pt counseled on preventing paronychias and wound care  ? ? ? ? ? ? ? ?Final Clinical Impression(s) / ED Diagnoses ?Final diagnoses:  ?Paronychia of right ring finger  ? ? ?Rx / DC Orders ?ED Discharge Orders   ? ? None  ? ?  ? ?An After Visit Summary was printed and given to the patient.  ?  ?Elson Areas, PA-C ?10/04/21 1613 ? ?  ?Maia Plan, MD ?10/05/21 1217 ? ?

## 2022-08-17 ENCOUNTER — Other Ambulatory Visit: Payer: Self-pay

## 2022-08-17 ENCOUNTER — Emergency Department
Admission: EM | Admit: 2022-08-17 | Discharge: 2022-08-17 | Disposition: A | Discharge status: Home / Self Care | Payer: Self-pay | Attending: Emergency Medicine | Admitting: Emergency Medicine

## 2022-08-17 DIAGNOSIS — L03012 Cellulitis of left finger: Secondary | ICD-10-CM

## 2022-08-17 DIAGNOSIS — L03011 Cellulitis of right finger: Secondary | ICD-10-CM | POA: Insufficient documentation

## 2022-08-17 NOTE — Discharge Instructions (Signed)
Your paronychia was drained.  Keep your hand clean with soap and water.  Please return for any new, worsening, or change in symptoms or other concerns.  It was a pleasure caring for you today.

## 2022-08-17 NOTE — ED Provider Notes (Signed)
[name] Provider Note    Event Date/Time   First MD Initiated Contact with Patient 08/17/22 1044     (approximate)   History   Finger Injury   HPI  Manuel Adams is a 32 y.o. male who presents today for evaluation of right third finger pain.  Patient reports that he hit it against something a few days ago and he has got progressively more pain and swelling along the ulnar side of his fingernail.  Able to flex extend his finger normally.  No other injury sustained.  There are no problems to display for this patient.         Physical Exam   Triage Vital Signs: ED Triage Vitals  Enc Vitals Group     BP 08/17/22 1044 (!) 137/94     Pulse Rate 08/17/22 1044 84     Resp 08/17/22 1044 18     Temp 08/17/22 1044 98.2 F (36.8 C)     Temp src --      SpO2 08/17/22 1044 96 %     Weight 08/17/22 1024 (!) 308 lb 10.3 oz (140 kg)     Height 08/17/22 1024 5' 10"$  (1.778 m)     Head Circumference --      Peak Flow --      Pain Score 08/17/22 1024 8     Pain Loc --      Pain Edu? --      Excl. in [place]? --     Most recent vital signs: Vitals:   08/17/22 1044  BP: (!) 137/94  Pulse: 84  Resp: 18  Temp: 98.2 F (36.8 C)  SpO2: 96%    Physical Exam Vitals and nursing note reviewed.  Constitutional:      General: Awake and alert. No acute distress.    Appearance: Normal appearance. The patient is obese.  HENT:     Head: Normocephalic and atraumatic.     Mouth: Mucous membranes are moist.  Eyes:     General: PERRL. Normal EOMs        Right eye: No discharge.        Left eye: No discharge.     Conjunctiva/sclera: Conjunctivae normal.  Cardiovascular:     Rate and Rhythm: Normal rate and regular rhythm.     Pulses: Normal pulses.  Pulmonary:     Effort: Pulmonary effort is normal. No respiratory distress.     Breath sounds: Normal breath sounds.  Abdominal:     Abdomen is soft. There is no abdominal tenderness.  Musculoskeletal:         General: No swelling. Normal range of motion.     Cervical back: Normal range of motion and neck supple.  Left third finger with paronychia to the ulnar aspect of fingernail.  No subungual hematoma or abscess noted.  Patient able to flex and extend at isolated PIP and DIP against resistance. Skin:    General: Skin is warm and dry.     Capillary Refill: Capillary refill takes less than 2 seconds.     Findings: No rash.  Neurological:     Mental Status: The patient is awake and alert.      ED Results / Procedures / Treatments   Labs (all labs ordered are listed, but only abnormal results are displayed) Labs Reviewed - No data to display   EKG     RADIOLOGY     PROCEDURES:  Critical Care performed:   Marland KitchenMarland KitchenIncision and  Drainage  Date/Time: 08/17/2022 11:18 AM  Performed by: Marquette Old, PA-C Authorized by: Marquette Old, PA-C   Consent:    Consent obtained:  Verbal   Consent given by:  Patient   Risks, benefits, and alternatives were discussed: yes     Risks discussed:  Damage to other organs, bleeding, incomplete drainage, infection and pain   Alternatives discussed:  No treatment Universal protocol:    Procedure explained and questions answered to patient or proxy's satisfaction: yes     Relevant documents present and verified: yes     Test results available : yes     Required blood products, implants, devices, and special equipment available: yes     Site/side marked: yes     Immediately prior to procedure, a time out was called: yes     Patient identity confirmed:  Verbally with patient Location:    Type:  Abscess   Location:  Upper extremity   Upper extremity location:  Finger   Finger location:  R long finger Pre-procedure details:    Skin preparation:  Antiseptic wash Sedation:    Sedation type:  None Anesthesia:    Anesthesia method:  None Procedure type:    Complexity:  Simple Procedure details:    Ultrasound guidance: no     Needle aspiration:  no     Incision types:  Stab incision   Incision depth:  Dermal   Wound management:  Probed and deloculated   Drainage:  Purulent   Drainage amount:  Moderate   Wound treatment:  Wound left open   Packing materials:  None Post-procedure details:    Procedure completion:  Tolerated well, no immediate complications    MEDICATIONS ORDERED IN ED: Medications - No data to display   IMPRESSION / MDM / [place] / ED COURSE  I reviewed the triage vital signs and the nursing notes.   Differential diagnosis includes, but is not limited to, paronychia, felon, cellulitis, ingrown nail.  Patient is awake and alert, hemodynamically stable and afebrile.  Normal range of motion of the PIP and DIP against resistance, not consistent with ligamental injury.  No bony tenderness to suggest fracture or dislocation.  He has obvious paronychia on exam without concurrent cellulitis.  The area was incised and drained with purulent output.  Patient endorsed significant improvement of his pain right away.  We discussed return precautions and the importance of close outpatient follow-up.  Patient understands and agrees with plan.  He was discharged in stable condition.   Patient's presentation is most consistent with acute complicated illness / injury requiring diagnostic workup.    FINAL CLINICAL IMPRESSION(S) / ED DIAGNOSES   Final diagnoses:  Paronychia of finger of left hand     Rx / DC Orders   ED Discharge Orders     None        Note:  This document was prepared using Dragon voice recognition software and may include unintentional dictation errors.   Marquette Old, PA-C 08/17/22 1152    Manuel Man, MD 08/18/22 [phone]

## 2022-08-17 NOTE — ED Triage Notes (Signed)
Pt with pain and swelling to right hand middle finger for 4 days. Pt reports he did hit it on his dogs cage but it wasn't hard so he is not sure how he hurt it. Pt reports painful to move it or make a fist.

## 2023-04-04 IMAGING — DX DG FINGER RING 2+V*R*
3 series · 3 of 3 positions shown · non-contrast
Comparison: None.

CLINICAL DATA: Finger swelling.

EXAM:
RIGHT RING FINGER 2+V

[finger ap]
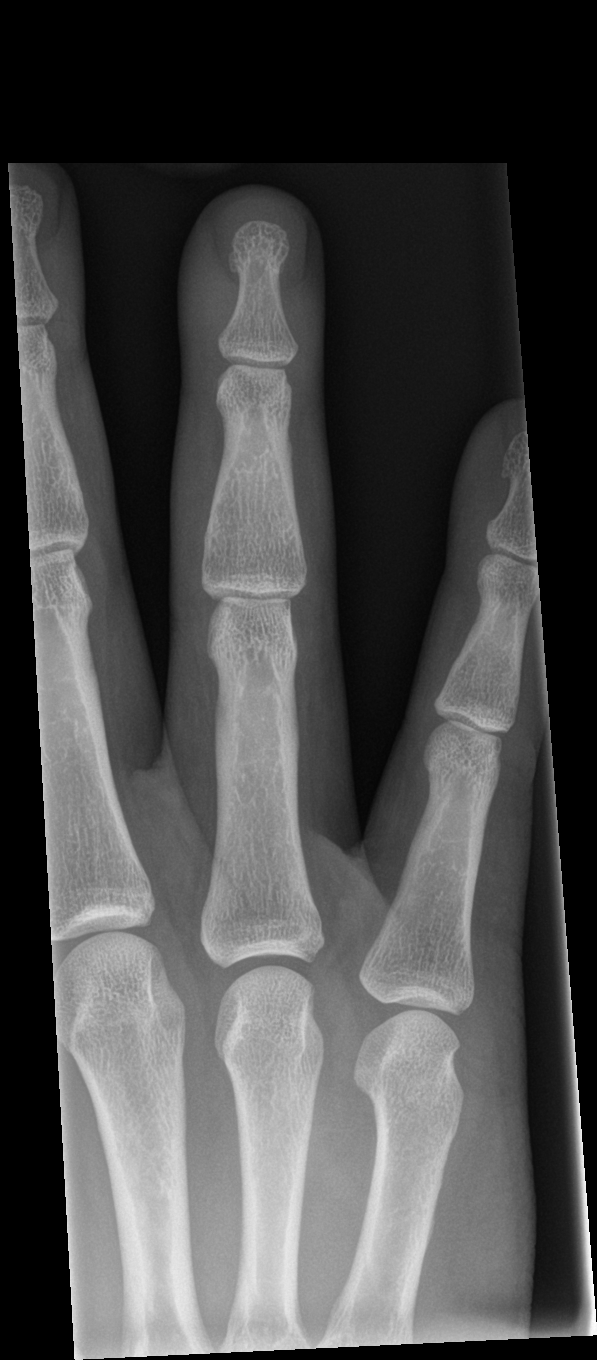

[finger obl]
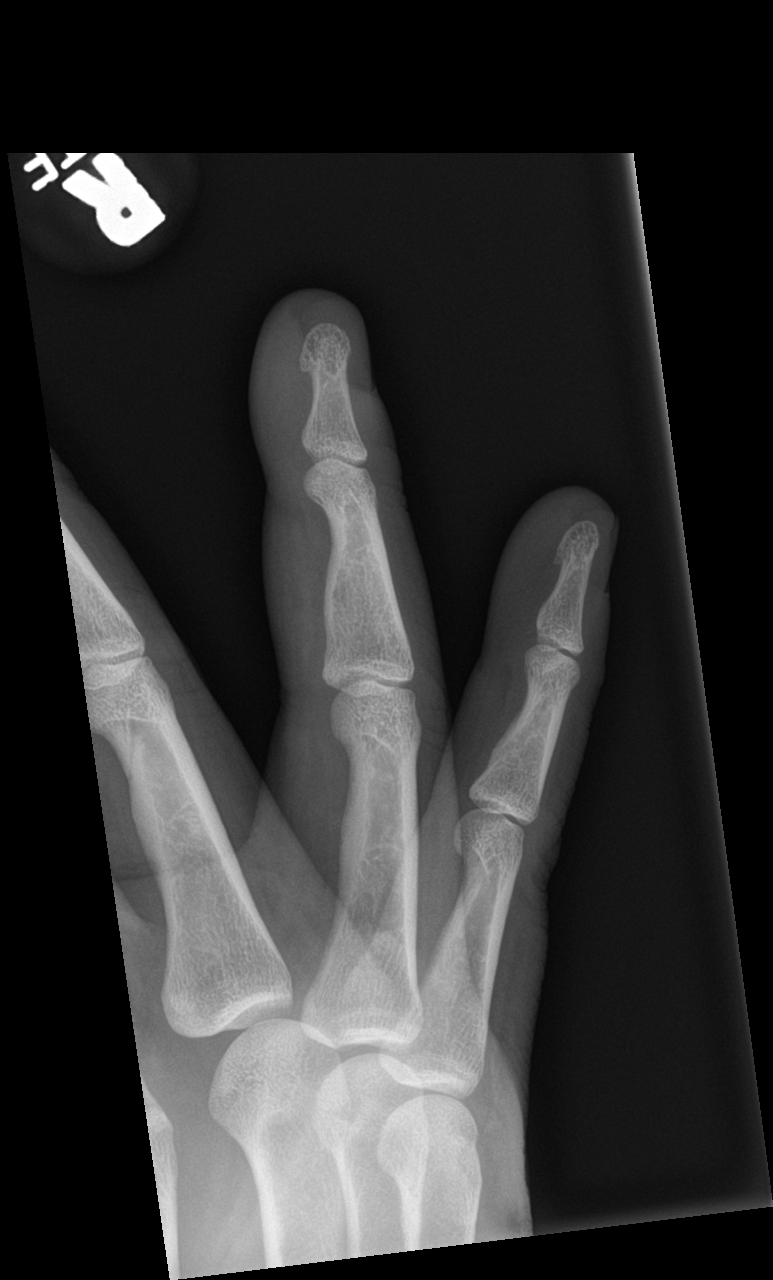

[finger lat]
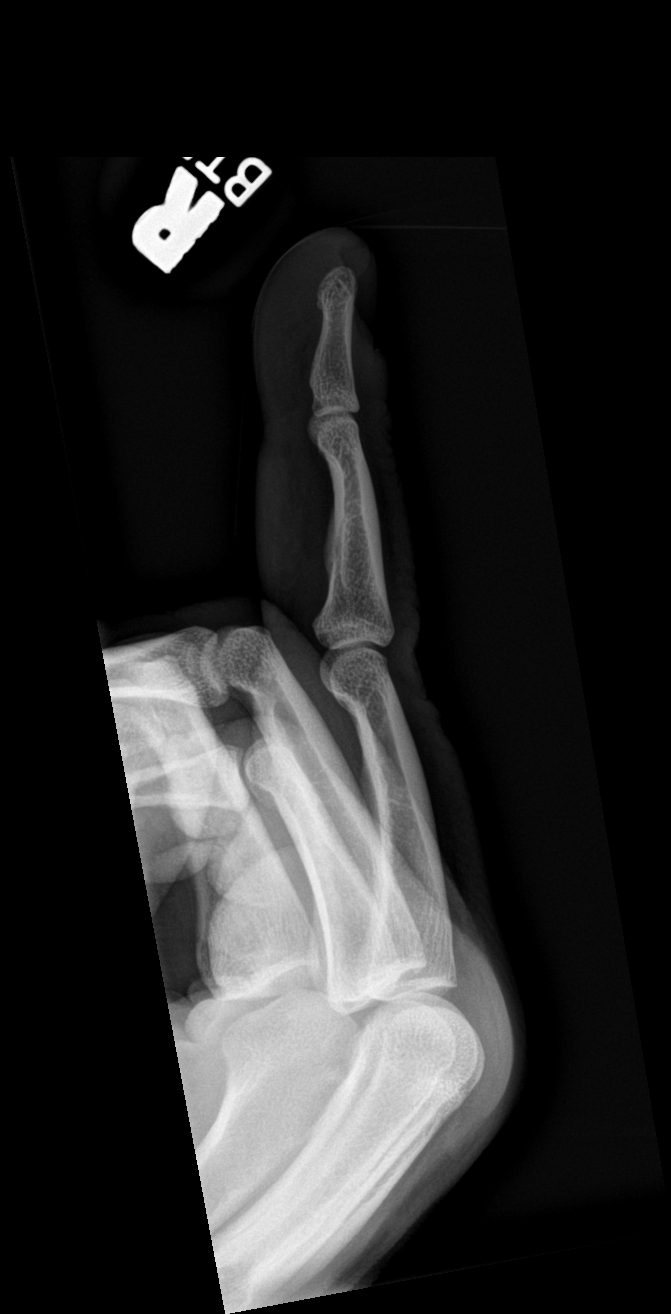

[3 of 3 positions shown; findings below may reference images not displayed]

FINDINGS: There is no evidence of fracture or dislocation. There is no
evidence of arthropathy or other focal bone abnormality. Soft
tissues are unremarkable.
IMPRESSION: Negative.

## 2024-05-18 ENCOUNTER — Emergency Department
Admission: EM | Admit: 2024-05-18 | Discharge: 2024-05-18 | Disposition: A | Discharge status: Home / Self Care | Payer: Self-pay | Attending: Emergency Medicine | Admitting: Emergency Medicine

## 2024-05-18 ENCOUNTER — Emergency Department: Payer: Self-pay

## 2024-05-18 ENCOUNTER — Other Ambulatory Visit: Payer: Self-pay

## 2024-05-18 DIAGNOSIS — J101 Influenza due to other identified influenza virus with other respiratory manifestations: Secondary | ICD-10-CM | POA: Insufficient documentation

## 2024-05-18 DIAGNOSIS — E876 Hypokalemia: Secondary | ICD-10-CM | POA: Insufficient documentation

## 2024-05-18 DIAGNOSIS — J45909 Unspecified asthma, uncomplicated: Secondary | ICD-10-CM | POA: Insufficient documentation

## 2024-05-18 LAB — CBC
HCT: 47.6 % (ref 39.0–52.0)
Hemoglobin: 15.9 g/dL (ref 13.0–17.0)
MCH: 30.7 pg (ref 26.0–34.0)
MCHC: 33.4 g/dL (ref 30.0–36.0)
MCV: 91.9 fL (ref 80.0–100.0)
Platelets: 179 K/uL (ref 150–400)
RBC: 5.18 MIL/uL (ref 4.22–5.81)
RDW: 12.3 % (ref 11.5–15.5)
WBC: 6 K/uL (ref 4.0–10.5)
nRBC: 0 % (ref 0.0–0.2)

## 2024-05-18 LAB — BASIC METABOLIC PANEL WITH GFR
Anion gap: 12 (ref 5–15)
BUN: 6 mg/dL (ref 6–20)
CO2: 26 mmol/L (ref 22–32)
Calcium: 9.1 mg/dL (ref 8.9–10.3)
Chloride: 102 mmol/L (ref 98–111)
Creatinine, Ser: 0.73 mg/dL (ref 0.61–1.24)
GFR, Estimated: >60 mL/min (ref >60)
Glucose, Bld: 100 mg/dL — ABNORMAL HIGH (ref 70–99)
Potassium: 3.4 mmol/L — ABNORMAL LOW (ref 3.5–5.1)
Sodium: 139 mmol/L (ref 135–145)

## 2024-05-18 LAB — RESP PANEL BY RT-PCR (RSV, FLU A&B, COVID)  RVPGX2
Influenza A by PCR: POSITIVE — AB
Influenza B by PCR: NEGATIVE
Resp Syncytial Virus by PCR: NEGATIVE
SARS Coronavirus 2 by RT PCR: NEGATIVE

## 2024-05-18 MED ORDER — IPRATROPIUM-ALBUTEROL 0.5-2.5 (3) MG/3ML IN SOLN
6.0000 mL | Freq: Once | RESPIRATORY_TRACT | Status: AC
  Administered 2024-05-18: 6 mL via RESPIRATORY_TRACT
  Filled 2024-05-18: qty 6

## 2024-05-18 NOTE — ED Triage Notes (Signed)
 Pt arrives via POV with c/o SOB, cough, sold sweats x 1 week. Pt states that they have chronic asthma but they have been having issues catching their breath for same time period. Pt stated that they haven't taken any OTC medications to help treat symptoms. Pt's breathing is labored during triage and becomes SOB on exertion. Pt is A&Ox4 and ambulatory during triage.

## 2024-05-18 NOTE — ED Notes (Signed)
 See triage note   Presents with some SOB  and low grade fever  States he developed sxs' about 1 week ago

## 2024-05-18 NOTE — ED Provider Notes (Signed)
 [facility] Provider Note    Event Date/Time   First MD Initiated Contact with Patient 05/18/24 [id number]     (approximate)   History   Shortness of Breath and Cough   HPI  Manuel Adams is a 33 y.o. male with PMH of asthma who presents for evaluation of SOB, cough, cold sweats x 1 week.  Patient reports some shortness of breath on exertion.  He has not taken any over-the-counter medications to treat his symptoms.      Physical Exam   Triage Vital Signs: ED Triage Vitals  Encounter Vitals Group     BP 05/18/24 0911 (!) 144/92     Girls Systolic BP Percentile --      Girls Diastolic BP Percentile --      Boys Systolic BP Percentile --      Boys Diastolic BP Percentile --      Pulse Rate 05/18/24 0911 (!) 103     Resp 05/18/24 0911 20     Temp 05/18/24 0911 99.9 F (37.7 C)     Temp Source 05/18/24 0911 Oral     SpO2 05/18/24 0911 98 %     Weight 05/18/24 0912 300 lb (136.1 kg)     Height 05/18/24 0912 5' 10 (1.778 m)     Head Circumference --      Peak Flow --      Pain Score 05/18/24 0911 9     Pain Loc --      Pain Education --      Exclude from Growth Chart --     Most recent vital signs: Vitals:   05/18/24 0911  BP: (!) 144/92  Pulse: (!) 103  Resp: 20  Temp: 99.9 F (37.7 C)  SpO2: 98%   General: Awake, visibly uncomfortable, agitated, sweaty CV:  Good peripheral perfusion. RRR. Resp:  Normal effort. Wheezing bilaterally with prolonged expiratory phase.  Abd:  No distention.  Other:  Oral mucous membranes are moist, pharynx is erythematous, no tonsillar enlargement or exudate   ED Results / Procedures / Treatments   Labs (all labs ordered are listed, but only abnormal results are displayed) Labs Reviewed  RESP PANEL BY RT-PCR (RSV, FLU A&B, COVID)  RVPGX2 - Abnormal; Notable for the following components:      Result Value   Influenza A by PCR POSITIVE (*)    All other components within normal limits  CBC  BASIC  METABOLIC PANEL WITH GFR     EKG  ED provider interpretation: Normal sinus rhythm no ST changes  Vent. rate 88 BPM PR interval 146 ms QRS duration 86 ms QT/QTcB 346/418 ms P-R-T axes 71 8 28   RADIOLOGY  Chest x-ray obtained, I interpreted the images as well as reviewed the radiologist report, which was negative for any acute cardiopulmonary abnormalities.   PROCEDURES:  Critical Care performed: No  Procedures   MEDICATIONS ORDERED IN ED: Medications  ipratropium-albuterol  (DUONEB) 0.5-2.5 (3) MG/3ML nebulizer solution 6 mL (6 mLs Nebulization Given 05/18/24 0948)     IMPRESSION / MDM / ASSESSMENT AND PLAN / ED COURSE  I reviewed the triage vital signs and the nursing notes.                             33 year old male presents for evaluation of SOB and cough. BP is elevated and patient is tachycardic. Patient is quite uncomfortable appearing on exam.  Differential diagnosis includes, but is not limited to, flu, covid, rsv, bronchitis, asthma exacerbation, other viral illness, pneumonia, pneumothorax, electrolyte abnormality.  Patient's presentation is most consistent with acute complicated illness / injury requiring diagnostic workup.  EKG shows normal sinus rhythm.  Chest x-ray is negative for acute abnormalities.  Will obtain labs and respiratory panel.  Patient did have wheezing bilaterally and a prolonged expiratory phase on exam.  Will give patient a breathing treatment and reassess for symptomatic improvement.  Clinical Course as of 05/18/24 1056  Tue May 18, 2024  1054 Nursing staff had difficulty getting blood draw, so lab was called to get the blood draw. Patient tested positive for flu A which explains his symptoms. He stated after the breathing treatment that he felt a little bit better and he was ready to go home.  He did not want to wait for the results of his lab work.  Discussed symptomatic treatment and encouraged rest.  Patient was given a note for  work.  He was understanding, questions were answered and he was stable at discharge. [LD]    Clinical Course User Index [LD] Cleaster Tinnie LABOR, PA-C     FINAL CLINICAL IMPRESSION(S) / ED DIAGNOSES   Final diagnoses:  Influenza A     Rx / DC Orders   ED Discharge Orders     None        Note:  This document was prepared using Dragon voice recognition software and may include unintentional dictation errors.   Cleaster Tinnie LABOR, PA-C 05/18/24 1512    Levander Slate, MD 05/18/24 1525

## 2024-05-18 NOTE — Discharge Instructions (Signed)
 You tested positive for the flu today.  This is a viral illness which will resolve on its own with time.  You do not need an antibiotic.  You can take over-the-counter cold medicine as needed to manage your symptoms.  If you are taking combination cold medicine keep in mind that this often contains Tylenol so if you need additional medication for body aches or fever control please take Motrin or ibuprofen.  Your symptoms should resolve with time, if you have had symptoms for greater than 10 days please be evaluated by another healthcare provider as at this point it may have developed into a bacterial infection which requires a different treatment.  Return to the emergency department with worsening symptoms.

## 2024-05-18 NOTE — ED Notes (Signed)
 Pt advised he does not want his blood drawn because he is a hard stick. I asked him if I could look and see if I could find a suitable site to use and he agreed. I did not find a suitable site so blood not drawn at this time.

## 2024-05-18 NOTE — ED Notes (Signed)
Lab notified of blood draw need
